# Patient Record
Sex: Female | Born: 1950 | Race: White | Hispanic: No | Marital: Married | Smoking: Former smoker
Health system: Southern US, Community
[De-identification: ages and names within clinical notes are randomized; demographics above are authoritative.]

## PROBLEM LIST (undated history)

## (undated) DIAGNOSIS — Z923 Personal history of irradiation: Secondary | ICD-10-CM

## (undated) DIAGNOSIS — C50919 Malignant neoplasm of unspecified site of unspecified female breast: Secondary | ICD-10-CM

## (undated) DIAGNOSIS — I4891 Unspecified atrial fibrillation: Secondary | ICD-10-CM

## (undated) DIAGNOSIS — I10 Essential (primary) hypertension: Secondary | ICD-10-CM

## (undated) DIAGNOSIS — Z9221 Personal history of antineoplastic chemotherapy: Secondary | ICD-10-CM

## (undated) HISTORY — PX: OOPHORECTOMY: SHX86

## (undated) HISTORY — PX: MASTECTOMY, PARTIAL: SHX709

## (undated) HISTORY — PX: ABDOMINAL HYSTERECTOMY: SHX81

---

## 2004-01-18 DIAGNOSIS — C50919 Malignant neoplasm of unspecified site of unspecified female breast: Secondary | ICD-10-CM

## 2004-01-18 HISTORY — PX: BREAST BIOPSY: SHX20

## 2004-01-18 HISTORY — PX: BREAST LUMPECTOMY: SHX2

## 2004-01-18 HISTORY — DX: Malignant neoplasm of unspecified site of unspecified female breast: C50.919

## 2004-07-28 ENCOUNTER — Ambulatory Visit: Payer: Self-pay

## 2004-08-11 ENCOUNTER — Ambulatory Visit: Payer: Self-pay | Admitting: Surgery

## 2004-08-17 ENCOUNTER — Ambulatory Visit: Payer: Self-pay | Admitting: Surgery

## 2004-09-02 ENCOUNTER — Inpatient Hospital Stay: Payer: Self-pay | Admitting: Surgery

## 2004-09-15 ENCOUNTER — Ambulatory Visit: Payer: Self-pay | Admitting: Oncology

## 2004-09-16 ENCOUNTER — Other Ambulatory Visit: Payer: Self-pay

## 2004-09-17 ENCOUNTER — Ambulatory Visit: Payer: Self-pay | Admitting: Oncology

## 2004-09-29 ENCOUNTER — Ambulatory Visit: Payer: Self-pay | Admitting: Surgery

## 2004-10-17 ENCOUNTER — Ambulatory Visit: Payer: Self-pay | Admitting: Oncology

## 2004-11-17 ENCOUNTER — Ambulatory Visit: Payer: Self-pay | Admitting: Oncology

## 2004-12-17 ENCOUNTER — Ambulatory Visit: Payer: Self-pay | Admitting: Oncology

## 2005-01-17 ENCOUNTER — Ambulatory Visit: Payer: Self-pay | Admitting: Oncology

## 2005-02-17 ENCOUNTER — Ambulatory Visit: Payer: Self-pay | Admitting: Oncology

## 2005-03-17 ENCOUNTER — Ambulatory Visit: Payer: Self-pay | Admitting: Oncology

## 2005-04-17 ENCOUNTER — Ambulatory Visit: Payer: Self-pay | Admitting: Oncology

## 2005-05-17 ENCOUNTER — Ambulatory Visit: Payer: Self-pay | Admitting: Oncology

## 2005-06-17 ENCOUNTER — Ambulatory Visit: Payer: Self-pay | Admitting: Oncology

## 2005-08-03 ENCOUNTER — Ambulatory Visit: Payer: Self-pay | Admitting: Oncology

## 2005-08-17 ENCOUNTER — Ambulatory Visit: Payer: Self-pay | Admitting: Oncology

## 2005-10-03 ENCOUNTER — Ambulatory Visit: Payer: Self-pay | Admitting: Oncology

## 2005-11-01 ENCOUNTER — Ambulatory Visit: Payer: Self-pay | Admitting: Oncology

## 2005-11-14 ENCOUNTER — Ambulatory Visit: Payer: Self-pay | Admitting: Unknown Physician Specialty

## 2005-11-17 ENCOUNTER — Ambulatory Visit: Payer: Self-pay | Admitting: Oncology

## 2006-03-01 ENCOUNTER — Ambulatory Visit: Payer: Self-pay | Admitting: Oncology

## 2006-03-18 ENCOUNTER — Ambulatory Visit: Payer: Self-pay | Admitting: Oncology

## 2006-06-18 ENCOUNTER — Ambulatory Visit: Payer: Self-pay | Admitting: Oncology

## 2006-06-19 ENCOUNTER — Ambulatory Visit: Payer: Self-pay | Admitting: Oncology

## 2006-06-22 ENCOUNTER — Ambulatory Visit: Payer: Self-pay | Admitting: Oncology

## 2006-07-18 ENCOUNTER — Ambulatory Visit: Payer: Self-pay | Admitting: Oncology

## 2006-10-04 ENCOUNTER — Ambulatory Visit: Payer: Self-pay | Admitting: Radiation Oncology

## 2006-10-18 ENCOUNTER — Ambulatory Visit: Payer: Self-pay | Admitting: Radiation Oncology

## 2006-10-20 ENCOUNTER — Ambulatory Visit: Payer: Self-pay | Admitting: Oncology

## 2006-11-18 ENCOUNTER — Ambulatory Visit: Payer: Self-pay | Admitting: Oncology

## 2006-11-18 ENCOUNTER — Ambulatory Visit: Payer: Self-pay | Admitting: Radiation Oncology

## 2006-12-27 ENCOUNTER — Ambulatory Visit: Payer: Self-pay | Admitting: Oncology

## 2007-02-18 ENCOUNTER — Ambulatory Visit: Payer: Self-pay | Admitting: Oncology

## 2007-02-22 ENCOUNTER — Ambulatory Visit: Payer: Self-pay | Admitting: Oncology

## 2007-03-18 ENCOUNTER — Ambulatory Visit: Payer: Self-pay | Admitting: Oncology

## 2007-06-21 ENCOUNTER — Ambulatory Visit: Payer: Self-pay | Admitting: Oncology

## 2007-09-05 ENCOUNTER — Ambulatory Visit: Payer: Self-pay | Admitting: Oncology

## 2007-09-18 ENCOUNTER — Ambulatory Visit: Payer: Self-pay | Admitting: Oncology

## 2007-10-18 ENCOUNTER — Ambulatory Visit: Payer: Self-pay | Admitting: Oncology

## 2007-11-26 ENCOUNTER — Inpatient Hospital Stay: Payer: Self-pay | Admitting: Surgery

## 2008-01-30 ENCOUNTER — Ambulatory Visit: Payer: Self-pay | Admitting: Oncology

## 2008-02-18 ENCOUNTER — Ambulatory Visit: Payer: Self-pay | Admitting: Oncology

## 2008-05-17 ENCOUNTER — Ambulatory Visit: Payer: Self-pay | Admitting: Oncology

## 2008-06-11 ENCOUNTER — Ambulatory Visit: Payer: Self-pay | Admitting: Oncology

## 2008-06-17 ENCOUNTER — Ambulatory Visit: Payer: Self-pay | Admitting: Oncology

## 2008-07-17 ENCOUNTER — Ambulatory Visit: Payer: Self-pay | Admitting: Oncology

## 2008-11-17 ENCOUNTER — Ambulatory Visit: Payer: Self-pay | Admitting: Oncology

## 2008-12-15 ENCOUNTER — Ambulatory Visit: Payer: Self-pay | Admitting: Oncology

## 2008-12-17 ENCOUNTER — Ambulatory Visit: Payer: Self-pay | Admitting: Oncology

## 2009-06-11 ENCOUNTER — Ambulatory Visit: Payer: Self-pay | Admitting: Oncology

## 2009-06-17 ENCOUNTER — Ambulatory Visit: Payer: Self-pay | Admitting: Oncology

## 2009-07-17 ENCOUNTER — Ambulatory Visit: Payer: Self-pay | Admitting: Oncology

## 2009-10-06 ENCOUNTER — Emergency Department: Payer: Self-pay | Admitting: Emergency Medicine

## 2009-12-28 ENCOUNTER — Ambulatory Visit: Payer: Self-pay | Admitting: Oncology

## 2009-12-29 LAB — CANCER ANTIGEN 27.29: CA 27.29: 32.1 U/mL (ref 0.0–38.6)

## 2010-01-17 ENCOUNTER — Ambulatory Visit: Payer: Self-pay | Admitting: Oncology

## 2010-06-29 ENCOUNTER — Ambulatory Visit: Payer: Self-pay | Admitting: Oncology

## 2010-07-05 ENCOUNTER — Ambulatory Visit: Payer: Self-pay | Admitting: Oncology

## 2010-07-18 ENCOUNTER — Ambulatory Visit: Payer: Self-pay | Admitting: Oncology

## 2010-08-24 ENCOUNTER — Ambulatory Visit: Payer: Self-pay | Admitting: Oncology

## 2010-09-18 ENCOUNTER — Ambulatory Visit: Payer: Self-pay | Admitting: Oncology

## 2010-12-28 ENCOUNTER — Ambulatory Visit: Payer: Self-pay | Admitting: Oncology

## 2011-01-18 ENCOUNTER — Ambulatory Visit: Payer: Self-pay | Admitting: Oncology

## 2011-07-06 ENCOUNTER — Ambulatory Visit: Payer: Self-pay | Admitting: Oncology

## 2011-07-07 ENCOUNTER — Ambulatory Visit: Payer: Self-pay | Admitting: Oncology

## 2011-07-07 LAB — CBC CANCER CENTER
Basophil #: 0 x10 3/mm (ref 0.0–0.1)
Basophil %: 0.8 %
Eosinophil #: 0.3 x10 3/mm (ref 0.0–0.7)
HCT: 38.9 % (ref 35.0–47.0)
Lymphocyte #: 1.7 x10 3/mm (ref 1.0–3.6)
MCH: 29.7 pg (ref 26.0–34.0)
MCV: 90 fL (ref 80–100)
Monocyte #: 0.6 x10 3/mm (ref 0.2–0.9)
Monocyte %: 11 %
Neutrophil #: 2.9 x10 3/mm (ref 1.4–6.5)
Platelet: 221 x10 3/mm (ref 150–440)
RBC: 4.35 10*6/uL (ref 3.80–5.20)
WBC: 5.4 x10 3/mm (ref 3.6–11.0)

## 2011-07-07 LAB — COMPREHENSIVE METABOLIC PANEL
Albumin: 3.7 g/dL (ref 3.4–5.0)
Alkaline Phosphatase: 115 U/L (ref 50–136)
BUN: 10 mg/dL (ref 7–18)
Bilirubin,Total: 0.4 mg/dL (ref 0.2–1.0)
Calcium, Total: 9 mg/dL (ref 8.5–10.1)
Chloride: 108 mmol/L — ABNORMAL HIGH (ref 98–107)
EGFR (African American): >60
Potassium: 4 mmol/L (ref 3.5–5.1)
SGOT(AST): 18 U/L (ref 15–37)
SGPT (ALT): 28 U/L
Sodium: 144 mmol/L (ref 136–145)

## 2011-07-08 LAB — CANCER ANTIGEN 27.29: CA 27.29: 24.7 U/mL (ref 0.0–38.6)

## 2011-07-18 ENCOUNTER — Ambulatory Visit: Payer: Self-pay | Admitting: Oncology

## 2012-07-09 ENCOUNTER — Ambulatory Visit: Payer: Self-pay | Admitting: Oncology

## 2012-07-17 ENCOUNTER — Ambulatory Visit: Payer: Self-pay | Admitting: Oncology

## 2012-08-17 ENCOUNTER — Ambulatory Visit: Payer: Self-pay | Admitting: Oncology

## 2013-01-26 ENCOUNTER — Observation Stay: Payer: Self-pay | Admitting: Internal Medicine

## 2013-01-26 LAB — URINALYSIS, COMPLETE
BACTERIA: NONE SEEN
Bilirubin,UR: NEGATIVE
Glucose,UR: NEGATIVE mg/dL (ref 0–75)
Nitrite: NEGATIVE
PROTEIN: NEGATIVE
Ph: 5 (ref 4.5–8.0)
RBC,UR: 10 /HPF (ref 0–5)
SQUAMOUS EPITHELIAL: NONE SEEN
Specific Gravity: 1.029 (ref 1.003–1.030)
WBC UR: 3 /HPF (ref 0–5)

## 2013-01-26 LAB — COMPREHENSIVE METABOLIC PANEL
ALBUMIN: 4.3 g/dL (ref 3.4–5.0)
ALK PHOS: 117 U/L
ALT: 21 U/L (ref 12–78)
ANION GAP: 6 — AB (ref 7–16)
BUN: 20 mg/dL — AB (ref 7–18)
Bilirubin,Total: 0.4 mg/dL (ref 0.2–1.0)
CHLORIDE: 107 mmol/L (ref 98–107)
CREATININE: 0.99 mg/dL (ref 0.60–1.30)
Calcium, Total: 9.1 mg/dL (ref 8.5–10.1)
Co2: 28 mmol/L (ref 21–32)
EGFR (African American): >60
GLUCOSE: 108 mg/dL — AB (ref 65–99)
Osmolality: 284 (ref 275–301)
Potassium: 3.8 mmol/L (ref 3.5–5.1)
SGOT(AST): 10 U/L — ABNORMAL LOW (ref 15–37)
Sodium: 141 mmol/L (ref 136–145)
TOTAL PROTEIN: 7 g/dL (ref 6.4–8.2)

## 2013-01-26 LAB — CBC
HCT: 41.9 % (ref 35.0–47.0)
HGB: 14.1 g/dL (ref 12.0–16.0)
MCH: 29.1 pg (ref 26.0–34.0)
MCHC: 33.6 g/dL (ref 32.0–36.0)
MCV: 87 fL (ref 80–100)
Platelet: 261 10*3/uL (ref 150–440)
RBC: 4.83 10*6/uL (ref 3.80–5.20)
RDW: 13.7 % (ref 11.5–14.5)
WBC: 11.2 10*3/uL — AB (ref 3.6–11.0)

## 2013-01-26 LAB — LIPID PANEL
CHOLESTEROL: 239 mg/dL — AB (ref 0–200)
HDL Cholesterol: 40 mg/dL (ref 40–60)
Ldl Cholesterol, Calc: 160 mg/dL — ABNORMAL HIGH (ref 0–100)
Triglycerides: 197 mg/dL (ref 0–200)
VLDL Cholesterol, Calc: 39 mg/dL (ref 5–40)

## 2013-01-26 LAB — PROTIME-INR
INR: 1
Prothrombin Time: 12.9 secs (ref 11.5–14.7)

## 2013-01-26 LAB — CK-MB
CK-MB: 1.2 ng/mL (ref 0.5–3.6)
CK-MB: 1.5 ng/mL (ref 0.5–3.6)

## 2013-01-26 LAB — TROPONIN I
Troponin-I: <0.02 ng/mL
Troponin-I: <0.02 ng/mL

## 2013-01-27 LAB — CBC WITH DIFFERENTIAL/PLATELET
Basophil #: 0.1 10*3/uL (ref 0.0–0.1)
Basophil %: 0.6 %
EOS ABS: 0.1 10*3/uL (ref 0.0–0.7)
EOS PCT: 1.5 %
HCT: 38.7 % (ref 35.0–47.0)
HGB: 13.5 g/dL (ref 12.0–16.0)
LYMPHS ABS: 2.3 10*3/uL (ref 1.0–3.6)
Lymphocyte %: 26.3 %
MCH: 30.2 pg (ref 26.0–34.0)
MCHC: 34.9 g/dL (ref 32.0–36.0)
MCV: 87 fL (ref 80–100)
Monocyte #: 0.8 x10 3/mm (ref 0.2–0.9)
Monocyte %: 8.9 %
NEUTROS ABS: 5.5 10*3/uL (ref 1.4–6.5)
NEUTROS PCT: 62.7 %
Platelet: 236 10*3/uL (ref 150–440)
RBC: 4.47 10*6/uL (ref 3.80–5.20)
RDW: 13.8 % (ref 11.5–14.5)
WBC: 8.8 10*3/uL (ref 3.6–11.0)

## 2013-01-27 LAB — BASIC METABOLIC PANEL
ANION GAP: 7 (ref 7–16)
BUN: 14 mg/dL (ref 7–18)
CALCIUM: 8.6 mg/dL (ref 8.5–10.1)
CHLORIDE: 108 mmol/L — AB (ref 98–107)
Co2: 24 mmol/L (ref 21–32)
Creatinine: 0.71 mg/dL (ref 0.60–1.30)
Glucose: 97 mg/dL (ref 65–99)
Osmolality: 278 (ref 275–301)
Potassium: 3.3 mmol/L — ABNORMAL LOW (ref 3.5–5.1)
SODIUM: 139 mmol/L (ref 136–145)

## 2013-01-27 LAB — TROPONIN I: Troponin-I: <0.02 ng/mL

## 2013-01-27 LAB — CK-MB: CK-MB: 0.9 ng/mL (ref 0.5–3.6)

## 2013-03-28 DIAGNOSIS — E7800 Pure hypercholesterolemia, unspecified: Secondary | ICD-10-CM | POA: Insufficient documentation

## 2013-03-28 DIAGNOSIS — G5600 Carpal tunnel syndrome, unspecified upper limb: Secondary | ICD-10-CM | POA: Insufficient documentation

## 2013-03-28 DIAGNOSIS — R51 Headache: Secondary | ICD-10-CM

## 2013-03-28 DIAGNOSIS — R519 Headache, unspecified: Secondary | ICD-10-CM | POA: Insufficient documentation

## 2013-05-22 DIAGNOSIS — R4182 Altered mental status, unspecified: Secondary | ICD-10-CM | POA: Insufficient documentation

## 2013-05-22 DIAGNOSIS — G454 Transient global amnesia: Secondary | ICD-10-CM | POA: Insufficient documentation

## 2013-07-10 ENCOUNTER — Ambulatory Visit: Payer: Self-pay | Admitting: Oncology

## 2013-08-21 ENCOUNTER — Ambulatory Visit: Payer: Self-pay | Admitting: Oncology

## 2013-09-17 ENCOUNTER — Ambulatory Visit: Payer: Self-pay | Admitting: Oncology

## 2014-05-02 ENCOUNTER — Other Ambulatory Visit: Payer: Self-pay | Admitting: Oncology

## 2014-05-02 DIAGNOSIS — Z853 Personal history of malignant neoplasm of breast: Secondary | ICD-10-CM

## 2014-05-10 NOTE — Discharge Summary (Signed)
PATIENT NAME:  Natalie Monroe, Natalie Monroe MR#:  [id number] DATE OF BIRTH:  1950/03/23  DATE OF ADMISSION:  01/26/2013 DATE OF DISCHARGE:  01/27/2013  ADMISSION DIAGNOSIS: Confusion, rule out cerebrovascular accident.   DISCHARGE DIAGNOSIS:  1.  Transient global amnesia.  2.  Migraine headaches.   CONSULTATIONS: None.   IMAGING: The patient underwent a MRI of the brain which was negative for acute ischemia.   Carotid ultrasound showed no hemodynamically significant stenosis.   A 2-D echocardiogram showed an EF of 65% to 70% with borderline left ventricular hypertrophy, impaired relaxation of LV diastolic dysfunction.   LABORATORY DATA: White blood cells 8.8, hemoglobin 13.5, hematocrit 39, and platelets 236. Sodium 139, potassium 3.3, chloride 108, bicarb 24, BUN 14, creatinine 0.71, and glucose is 97. Troponins were negative.  HOSPITAL COURSE: A very pleasant 64 year old female who presented with confusion. For further details, please refer to the H and P.  1.  Transient global amnesia. The patient underwent CVA work-up including a 2-D echocardiogram, MRI, and carotid ultrasound all of which were essentially negative. I suspect her symptoms actually were from transient global amnesia probably precipitated by her history of migraines as well as stress, possibly also chemical induced as she was surrounded by toxic fumes while cleaning her bathroom. Her memory has now resolved. I did give her some information regarding transient global amnesia and she follows up with Dr. Manuella Ghazi, [facility] neurology, which I recommended she continue to follow up with regarding her migraines and this new diagnosis.  2.  Migraines. The patient for now we will continue her outpatient medications.   DISCHARGE MEDICATIONS: 1.  Zoloft 50 mg daily. 2.  Propranolol 80 mg daily.  3.  Amitriptyline 50 mg as needed for migraines.  4.  Triamcinolone to effected area daily.  DISCHARGE DIET: Regular diet.   DISCHARGE  ACTIVITY: As tolerated.   DISCHARGE FOLLOWUP: She will follow up with Dr. Lovie Macadamia in 1 week and [facility] neurology in 2 to 4 weeks.   The patient is medically stable for discharge.  TIME SPENT: Approximately 35 minutes.   ___________________________ Donell Beers. Benjie Karvonen, MD spm:sb D: 01/27/2013 15:15:22 ET T: 01/28/2013 09:03:10 ET JOB#: [id number]  cc: Keneisha Heckart P. Benjie Karvonen, MD, <Dictator> Youlanda Roys. Lovie Macadamia, MD Hemang K. Manuella Ghazi, MD Donell Beers Martinez Boxx MD ELECTRONICALLY SIGNED 01/28/2013 14:21

## 2014-05-10 NOTE — H&P (Signed)
Monroe NAME:  Natalie Monroe, Natalie Monroe MR#:  [id number] DATE OF BIRTH:  30-Dec-1950  DATE OF ADMISSION:  01/26/2013  ADMITTING PHYSICIAN: Gladstone Lighter, MD  PRIMARY CARE PHYSICIAN: Youlanda Roys. Lovie Macadamia, MD  CHIEF COMPLAINT: Confusion and memory loss.   HISTORY OF PRESENT ILLNESS: Natalie Monroe is a 64 year old, very pleasant Caucasian female with past medical history significant for hypertension, migraine headaches, history of breast cancer, status post partial mastectomy on Natalie right side with chemoradiation, currently in remission, who presents to [facility] hospital secondary to acute onset of memory loss and confusion that started this afternoon. Natalie Monroe is alert and oriented at this time. Husband is at bedside, but Natalie Monroe does not remember anything that happened this afternoon. According to husband, she woke up fine. She ate her breakfast. They were thinking about cleaning Natalie house to put it on sale. He left Natalie Monroe while she was ready to clean her bathroom with Clorox and Lysol. He came back home after 25 minutes, and she has been walking confused in Natalie room and says something is wrong. Now Natalie Monroe does not remember any of these events. She keeps saying that she smells Clorox now. She does not remember what she had for breakfast. She still remembers her physician's name, her medications, her husband's name, but does not have that memory of what happened today. She does have a strong history of strokes in Natalie family. Does not complain of any other loss of motor strength or sensation or visual changes or dysphagia.   PAST MEDICAL HISTORY: 1.  Migraine headaches.  2.  History of breast cancer, currently in remission. 3.  Hypertension.   PAST SURGICAL HISTORY: 1.  Hysterectomy for fibroids.  2.  Right breast partial mastectomy. 3.  Right breast lumpectomy.   ALLERGIES TO MEDICATIONS: No known drug allergies.   CURRENT HOME MEDICATIONS:  1.  Sertraline 50 mg p.o. daily.  2.   Amitriptyline 50 mg p.r.n. for headaches.  3.  Propranolol 80 mg p.o. daily.  4.  Triamcinolone topical solution as needed for rash.   SOCIAL HISTORY: Lives at home with her husband. Used to smoke in Natalie past but quit smoking more than 20 years ago now. Occasional wine drinking. No other recreational drugs. Currently works in a clerical position.   FAMILY HISTORY: Significant for heart disease and stroke in Natalie family.   REVIEW OF SYSTEMS:    CONSTITUTIONAL: No fever, fatigue or weakness.  EYES: No blurred vision, double vision, pain, inflammation or glaucoma.  EARS, NOSE, THROAT: No tinnitus, ear pain, hearing loss, epistaxis or discharge.  RESPIRATORY: No cough, wheeze, hemoptysis or COPD.  CARDIOVASCULAR: No chest pain, orthopnea, edema, arrhythmia, palpitations or syncope.  GASTROINTESTINAL: No nausea, vomiting, abdominal pain, hematemesis or melena.  GENITOURINARY: No dysuria, hematochezia, renal calculus, frequency or incontinence.  ENDOCRINE: No polyuria, nocturia, thyroid problems, heat or cold intolerance.  HEMATOLOGIC: No anemia, easy bruising or bleeding.  SKIN: No acne, rash or lesions.  MUSCULOSKELETAL: No neck, back, shoulder pain, arthritis or gout.  NEUROLOGIC: No numbness, weakness, CVA, TIA or seizures.  PSYCHOLOGICAL: No anxiety, insomnia, depression.   PHYSICAL EXAMINATION: VITAL SIGNS: Temperature of 98 degrees Fahrenheit, pulse 91, respirations 20, blood pressure 175/78, pulse oximetry 98% on room air.  GENERAL: Well-built, well-nourished female lying in bed, not in any acute distress.  HEENT: Normocephalic, atraumatic. Pupils equal, round, reacting to light. Anicteric sclerae. Extraocular movements intact. Oropharynx clear without erythema, mass or exudates.  NECK: Supple. No thyromegaly, JVD or carotid  bruits. Normal range of motion without any pain.  LUNGS: Clear to auscultation bilaterally. No wheeze or crackles. No use of accessory muscles for  breathing. CARDIOVASCULAR: S1, S2, regular rate and rhythm. No murmurs, rubs or gallops.  ABDOMEN: Soft, nontender, nondistended. No hepatosplenomegaly. Normal bowel sounds.  EXTREMITIES: No pedal edema, no clubbing or cyanosis, 2+ dorsalis pedis pulses bilaterally.  SKIN: No acne, rash or lesions.  LYMPHATICS: No cervical lymphadenopathy.  NEUROLOGIC: Cranial nerves II through XII remain intact. Motor strength 5/5 all 4 extremities. Sensation is intact. No abnormal cerebellar function tests.  PSYCHOLOGIC: Natalie Monroe is awake, alert, oriented x 3 at this time.   LABORATORY DATA: WBC 11.3, hemoglobin 14.1, hematocrit 41. 0.9, platelet count 61.   Sodium 141, potassium 3.8, chloride 107, bicarbonate 28, BUN 20, creatinine 0.99, glucose 108, and calcium of 9.1.   ALT 21, AST 10, alkaline phosphatase 117, total bilirubin 0.4, and albumin of 4.3. INR is 1.0. Troponin less than 0.02. Urinalysis negative for any infection.   CT of Natalie head without any contrast showing no acute intracranial abnormality. Chest x-ray showing no active cardiopulmonary disease.   Venous gas showing pH of 7.35, pCO2 of 48.   Co-oximetry showing carboxyhemoglobin is low at 1.4 and methemoglobin is also low at 0.3.   EKG showing normal sinus rhythm, T wave inversions noted in V4 and V5, which are seen on a previous EKG, and heart rate of 87.   ASSESSMENT AND PLAN: This is a 64 year old female with history of hypertension and breast cancer in remission, presents with acute memory loss and confusion.  1.  Acute onset of memory loss and confusion, started this afternoon and could not remember any events from this afternoon. Natalie Monroe is now more alert and oriented. Could be transient ischemic attack versus a chemical reaction, as she has been exposed to Clorox and Lysol fumes in a closed room. No respiratory issues at this time. Will admit her under observation to telemetry. Check MRI of Natalie brain, carotid Dopplers, and  echocardiogram. Also, she reports of dysosmia. Started her on aspirin and check lipid profile.  2.  Hypertension: Continue propranolol. 3.  Breast cancer: Seems to be in remission. She had Natalie cancer in 2006. She had partial mastectomy on Natalie right side, chemo and radiation, and finished her hormonal treatment.  4.  Deep vein thrombosis prophylaxis with Lovenox.  CODE STATUS: Full code.   TIME SPENT ON ADMISSION: 50 minutes.   ____________________________ Gladstone Lighter, MD rk:jcm D: 01/26/2013 19:16:28 ET T: 01/26/2013 20:07:03 ET JOB#: [id number]  cc: Gladstone Lighter, MD, <Dictator> Youlanda Roys. Lovie Macadamia, MD Gladstone Lighter MD ELECTRONICALLY SIGNED 01/29/2013 14:20

## 2014-07-10 ENCOUNTER — Other Ambulatory Visit: Payer: Self-pay | Admitting: Urology

## 2014-07-10 DIAGNOSIS — R319 Hematuria, unspecified: Secondary | ICD-10-CM

## 2014-07-14 ENCOUNTER — Ambulatory Visit
Admission: RE | Admit: 2014-07-14 | Discharge: 2014-07-14 | Disposition: A | Discharge status: Home / Self Care | Payer: BLUE CROSS/BLUE SHIELD | Source: Ambulatory Visit | Attending: Oncology | Admitting: Oncology

## 2014-07-14 DIAGNOSIS — Z1231 Encounter for screening mammogram for malignant neoplasm of breast: Secondary | ICD-10-CM | POA: Diagnosis not present

## 2014-07-14 DIAGNOSIS — Z853 Personal history of malignant neoplasm of breast: Secondary | ICD-10-CM | POA: Insufficient documentation

## 2014-07-14 HISTORY — DX: Malignant neoplasm of unspecified site of unspecified female breast: C50.919

## 2014-07-15 ENCOUNTER — Ambulatory Visit
Admission: RE | Admit: 2014-07-15 | Discharge: 2014-07-15 | Disposition: A | Discharge status: Home / Self Care | Payer: BLUE CROSS/BLUE SHIELD | Source: Ambulatory Visit | Attending: Urology | Admitting: Urology

## 2014-07-15 DIAGNOSIS — R310 Gross hematuria: Secondary | ICD-10-CM | POA: Diagnosis present

## 2014-07-15 DIAGNOSIS — R319 Hematuria, unspecified: Secondary | ICD-10-CM

## 2014-07-15 DIAGNOSIS — N202 Calculus of kidney with calculus of ureter: Secondary | ICD-10-CM | POA: Insufficient documentation

## 2014-07-15 HISTORY — DX: Essential (primary) hypertension: I10

## 2014-07-15 MED ORDER — IOHEXOL 300 MG/ML  SOLN
125.0000 mL | Freq: Once | INTRAMUSCULAR | Status: AC | PRN
  Administered 2014-07-15: 115 mL via INTRAVENOUS

## 2014-08-06 DIAGNOSIS — C50919 Malignant neoplasm of unspecified site of unspecified female breast: Secondary | ICD-10-CM | POA: Insufficient documentation

## 2014-08-20 ENCOUNTER — Other Ambulatory Visit: Payer: Self-pay | Admitting: Oncology

## 2014-08-21 LAB — CBC WITH DIFFERENTIAL/PLATELET
BASOS: 1 %
Basophils Absolute: 0 10*3/uL (ref 0.0–0.2)
EOS (ABSOLUTE): 0.3 10*3/uL (ref 0.0–0.4)
Eos: 4 %
Hematocrit: 40.9 % (ref 34.0–46.6)
Hemoglobin: 14 g/dL (ref 11.1–15.9)
IMMATURE GRANULOCYTES: 0 %
Immature Grans (Abs): 0 10*3/uL (ref 0.0–0.1)
LYMPHS: 31 %
Lymphocytes Absolute: 2.3 10*3/uL (ref 0.7–3.1)
MCH: 29.7 pg (ref 26.6–33.0)
MCHC: 34.2 g/dL (ref 31.5–35.7)
MCV: 87 fL (ref 79–97)
MONOS ABS: 0.6 10*3/uL (ref 0.1–0.9)
Monocytes: 8 %
Neutrophils Absolute: 4.1 10*3/uL (ref 1.4–7.0)
Neutrophils: 56 %
PLATELETS: 268 10*3/uL (ref 150–379)
RBC: 4.72 x10E6/uL (ref 3.77–5.28)
RDW: 13.6 % (ref 12.3–15.4)
WBC: 7.4 10*3/uL (ref 3.4–10.8)

## 2014-08-21 LAB — COMPREHENSIVE METABOLIC PANEL
A/G RATIO: 2.6 — AB (ref 1.1–2.5)
ALT: 12 IU/L (ref 0–32)
AST: 11 IU/L (ref 0–40)
Albumin: 4.5 g/dL (ref 3.6–4.8)
Alkaline Phosphatase: 94 IU/L (ref 39–117)
BILIRUBIN TOTAL: 0.2 mg/dL (ref 0.0–1.2)
BUN/Creatinine Ratio: 24 (ref 11–26)
BUN: 17 mg/dL (ref 8–27)
CALCIUM: 9.3 mg/dL (ref 8.7–10.3)
CO2: 17 mmol/L — ABNORMAL LOW (ref 18–29)
CREATININE: 0.71 mg/dL (ref 0.57–1.00)
Chloride: 105 mmol/L (ref 97–108)
GFR, EST AFRICAN AMERICAN: 106 mL/min/{1.73_m2} (ref >59)
GFR, EST NON AFRICAN AMERICAN: 92 mL/min/{1.73_m2} (ref >59)
Globulin, Total: 1.7 g/dL (ref 1.5–4.5)
Glucose: 99 mg/dL (ref 65–99)
POTASSIUM: 4.5 mmol/L (ref 3.5–5.2)
Sodium: 145 mmol/L — ABNORMAL HIGH (ref 134–144)
Total Protein: 6.2 g/dL (ref 6.0–8.5)

## 2014-08-21 LAB — CA 27.29 (SERIAL MONITOR): CA 27.29: 24.1 U/mL (ref 0.0–38.6)

## 2014-08-27 ENCOUNTER — Encounter: Payer: Self-pay | Admitting: Oncology

## 2014-08-27 ENCOUNTER — Inpatient Hospital Stay: Payer: BLUE CROSS/BLUE SHIELD | Attending: Oncology | Admitting: Oncology

## 2014-08-27 VITALS — BP 133/78 | HR 74 | Temp 97.1°F | Wt 143.5 lb

## 2014-08-27 DIAGNOSIS — Z7982 Long term (current) use of aspirin: Secondary | ICD-10-CM

## 2014-08-27 DIAGNOSIS — Z79899 Other long term (current) drug therapy: Secondary | ICD-10-CM | POA: Diagnosis not present

## 2014-08-27 DIAGNOSIS — I10 Essential (primary) hypertension: Secondary | ICD-10-CM | POA: Insufficient documentation

## 2014-08-27 DIAGNOSIS — Z853 Personal history of malignant neoplasm of breast: Secondary | ICD-10-CM

## 2014-08-27 DIAGNOSIS — Z87891 Personal history of nicotine dependence: Secondary | ICD-10-CM | POA: Diagnosis not present

## 2014-08-27 DIAGNOSIS — C50911 Malignant neoplasm of unspecified site of right female breast: Secondary | ICD-10-CM

## 2014-08-27 DIAGNOSIS — Z9221 Personal history of antineoplastic chemotherapy: Secondary | ICD-10-CM | POA: Insufficient documentation

## 2014-08-27 NOTE — Progress Notes (Signed)
Patient does not have living will.  Materials given.  Former smoker.  Patient currently having problems with renal calculi.  Feels like she has a bladder infection,  Dr. Eliberto Ivory following patient.

## 2014-08-30 ENCOUNTER — Encounter: Payer: Self-pay | Admitting: Oncology

## 2014-08-30 NOTE — Progress Notes (Signed)
[facility]set @ [facility] Telephone:(336) [phone]  Fax:(336) [place]: [birth year]  MR#: [id number]  URK#:[id number]  Patient Care Team: Juluis Pitch, MD as PCP - General (Family Medicine)  CHIEF COMPLAINT:  Chief Complaint  Patient presents with  . Follow-up   Chief Complaint/Problem List:  1.  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 3.2 cm in size. ER/PR positive. 1/13 lymph nodes are positive. Status post lumpectomy and axillary node dissection. T2N1M0 tumor. Diagnosis in August 2006. Stage II-A. HER-2/neu negative. ER/PR positive. 2.  Finished chemotherapy in December of 2006 with Taxotere, Cytoxan, Adriamycin 6 cycles. 3.  Finished radiation therapy to the breast in March of 2007. 4.  Started on Arimidex in December of 2006.   Switch to tamoxifen because of side effect to joint pains.  Patient has stopped taking tamoxifen in December of 2011    INTERVAL HISTORY:  64 year old lady came today for yearly checkup regarding carcinoma of breast.  Patient is off all antibiotic hormonal therapy.  Since last evaluation patient did not have any significant problem.  No chills.  No fever.  No nausea.  No vomiting.  No bony pains.  Getting regular mammograms done REVIEW OF SYSTEMS:   GENERAL:  Feels good.  Active.  No fevers, sweats or weight loss. PERFORMANCE STATUS (ECOG0 HEENT:  No visual changes, runny nose, sore throat, mouth sores or tenderness. Lungs: No shortness of breath or cough.  No hemoptysis. Cardiac:  No chest pain, palpitations, orthopnea, or PND. GI:  No nausea, vomiting, diarrhea, constipation, melena or hematochezia. GU:  No urgency, frequency, dysuria, or hematuria. Musculoskeletal:  No back pain.  No joint pain.  No muscle tenderness. Extremities:  No pain or swelling. Skin:  No rashes or skin changes. Neuro:  No headache, numbness or weakness, balance or coordination issues. Endocrine:  No diabetes, thyroid issues, hot  flashes or night sweats. Psych:  No mood changes, depression or anxiety. Pain:  No focal pain. Review of systems:  All other systems reviewed and found to be negative. As per HPI. Otherwise, a complete review of systems is negatve.  PAST MEDICAL HISTORY: Past Medical History  Diagnosis Date  . Breast cancer 2006    right breast, chemo and radiation  . Hypertension     PAST SURGICAL HISTORY: Past Surgical History  Procedure Laterality Date  . Breast biopsy Bilateral 2006    left negative right positive  . Breast lumpectomy Right 2006    positive    FAMILY HISTORY Family History  Problem Relation Age of Onset  . Breast cancer Maternal Aunt 70   Outpatient Medications (11) Hospital Medications (0) Clinic-Administered Medications (0)    Aspirin-Acetaminophen-Caffeine (EXCEDRIN PO)   Biotin 1000 MCG tablet   Cyanocobalamin (RA VITAMIN B-12 TR) 1000 MCG TBCR   metoprolol succinate (TOPROL-XL) 100 MG 24 hr tablet   NAPROXEN PO   NUCYNTA 50 MG TABS tablet   ondansetron (ZOFRAN-ODT) 8 MG disintegrating tablet   sertraline (ZOLOFT) 50 MG tablet   SUMAtriptan (IMITREX) 100 MG tablet   triamcinolone cream (KENALOG) 0.1 %   valACYclovir (VALTREX) 1000 MG tablet     ADVANCED DIRECTIVES:  Patient does not have any living will or healthcare power of attorney.  Information was given .  Available resources had been discussed.  We will follow-up on subsequent appointments regarding this issue HEALTH MAINTENANCE: Social History  Substance Use Topics  . Smoking status: Former Research scientist (life sciences)  . Smokeless tobacco: None  . Alcohol  Use: None      No Known Allergies    OBJECTIVE:  Filed Vitals:   08/27/14 1020  BP: 133/78  Pulse: 74  Temp: 97.1 F (36.2 C)     There is no height on file to calculate BMI.    ECOG FS:0 - Asymptomatic  PHYSICAL EXAM: GENERAL:  Well developed, well nourished, sitting comfortably in the exam room in no acute distress. MENTAL STATUS:  Alert and  oriented to person, place and time.  ENT:  Oropharynx clear without lesion.  Tongue normal. Mucous membranes moist.  RESPIRATORY:  Clear to auscultation without rales, wheezes or rhonchi. CARDIOVASCULAR:  Regular rate and rhythm without murmur, rub or gallop. BREAST:  Right breast without masses, skin changes or nipple discharge.  Left breast without masses, skin changes or nipple discharge. ABDOMEN:  Soft, non-tender, with active bowel sounds, and no hepatosplenomegaly.  No masses. BACK:  No CVA tenderness.  No tenderness on percussion of the back or rib cage. SKIN:  No rashes, ulcers or lesions. EXTREMITIES: No edema, no skin discoloration or tenderness.  No palpable cords. LYMPH NODES: No palpable cervical, supraclavicular, axillary or inguinal adenopathy  NEUROLOGICAL: Unremarkable. PSYCH:  Appropriate.   LAB RESULTS:  CBC Latest Ref Rng 08/20/2014 01/27/2013  WBC 3.4 - 10.8 x10E3/uL 7.4 8.8  Hemoglobin 12.0-16.0 g/dL - 13.5  Hematocrit 34.0 - 46.6 % 40.9 38.7  Platelets 150-440 x10 3/mm 3 - 236    No visits with results within 5 Day(s) from this visit. Latest known visit with results is:  Orders Only on 08/20/2014  Component Date Value Ref Range Status  . WBC 08/20/2014 7.4  3.4 - 10.8 x10E3/uL Final  . RBC 08/20/2014 4.72  3.77 - 5.28 x10E6/uL Final  . Hemoglobin 08/20/2014 14.0  11.1 - 15.9 g/dL Final  . Hematocrit 08/20/2014 40.9  34.0 - 46.6 % Final  . MCV 08/20/2014 87  79 - 97 fL Final  . MCH 08/20/2014 29.7  26.6 - 33.0 pg Final  . MCHC 08/20/2014 34.2  31.5 - 35.7 g/dL Final  . RDW 08/20/2014 13.6  12.3 - 15.4 % Final  . Platelets 08/20/2014 268  150 - 379 x10E3/uL Final  . Neutrophils 08/20/2014 56   Final  . Lymphs 08/20/2014 31   Final  . Monocytes 08/20/2014 8   Final  . Eos 08/20/2014 4   Final  . Basos 08/20/2014 1   Final  . Neutrophils Absolute 08/20/2014 4.1  1.4 - 7.0 x10E3/uL Final  . Lymphocytes Absolute 08/20/2014 2.3  0.7 - 3.1 x10E3/uL Final  .  Monocytes Absolute 08/20/2014 0.6  0.1 - 0.9 x10E3/uL Final  . EOS (ABSOLUTE) 08/20/2014 0.3  0.0 - 0.4 x10E3/uL Final  . Basophils Absolute 08/20/2014 0.0  0.0 - 0.2 x10E3/uL Final  . Immature Granulocytes 08/20/2014 0   Final  . Immature Grans (Abs) 08/20/2014 0.0  0.0 - 0.1 x10E3/uL Final  . Glucose 08/20/2014 99  65 - 99 mg/dL Final  . BUN 08/20/2014 17  8 - 27 mg/dL Final  . Creatinine, Ser 08/20/2014 0.71  0.57 - 1.00 mg/dL Final  . GFR calc non Af Amer 08/20/2014 92  >59 mL/min/1.73 Final  . GFR calc Af Amer 08/20/2014 106  >59 mL/min/1.73 Final  . BUN/Creatinine Ratio 08/20/2014 24  11 - 26 Final  . Sodium 08/20/2014 145* 134 - 144 mmol/L Final  . Potassium 08/20/2014 4.5  3.5 - 5.2 mmol/L Final  . Chloride 08/20/2014 105  97 - 108 mmol/L  Final  . CO2 08/20/2014 17* 18 - 29 mmol/L Final  . Calcium 08/20/2014 9.3  8.7 - 10.3 mg/dL Final  . Total Protein 08/20/2014 6.2  6.0 - 8.5 g/dL Final  . Albumin 08/20/2014 4.5  3.6 - 4.8 g/dL Final  . Globulin, Total 08/20/2014 1.7  1.5 - 4.5 g/dL Final  . Albumin/Globulin Ratio 08/20/2014 2.6* 1.1 - 2.5 Final  . Bilirubin Total 08/20/2014 0.2  0.0 - 1.2 mg/dL Final  . Alkaline Phosphatase 08/20/2014 94  39 - 117 IU/L Final  . AST 08/20/2014 11  0 - 40 IU/L Final  . ALT 08/20/2014 12  0 - 32 IU/L Final  . CA 27.29 08/20/2014 24.1  0.0 - 38.6 U/mL Final   [facility]/ACS methodology       ASSESSMENT: Carcinoma  of right breast T2 N1 M0 tumor status post lumpectomy radiation therapy and chemotherapy On clinical examination there is no evidence of recurrent or progressive disease  MEDICAL DECISION MAKING:  All lab data has been reviewed they are within normal limits tumor markers are within normal limit  Patient expressed understanding and was in agreement with this plan. She also understands that She can call clinic at any time with any questions, concerns, or complaints.    No matching staging information was found for the  patient.  Forest Gleason, MD   08/30/2014 9:19 AM

## 2015-04-13 DIAGNOSIS — I10 Essential (primary) hypertension: Secondary | ICD-10-CM | POA: Insufficient documentation

## 2015-07-16 ENCOUNTER — Ambulatory Visit
Admission: RE | Admit: 2015-07-16 | Discharge: 2015-07-16 | Disposition: A | Discharge status: Home / Self Care | Payer: BLUE CROSS/BLUE SHIELD | Source: Ambulatory Visit | Attending: Oncology | Admitting: Oncology

## 2015-07-16 ENCOUNTER — Other Ambulatory Visit: Payer: Self-pay | Admitting: Oncology

## 2015-07-16 DIAGNOSIS — C50911 Malignant neoplasm of unspecified site of right female breast: Secondary | ICD-10-CM

## 2015-07-16 DIAGNOSIS — Z1231 Encounter for screening mammogram for malignant neoplasm of breast: Secondary | ICD-10-CM | POA: Insufficient documentation

## 2015-08-17 ENCOUNTER — Encounter: Payer: Self-pay | Admitting: Oncology

## 2015-08-26 NOTE — Progress Notes (Signed)
[facility] day:  08/27/15  Chief Complaint: Natalie Monroe is a 65 y.o. female with stage IIA right breast cancer who is seen for reassessment.  HPI:   The patient was diagnosed with right breast cancer in 2006.  Breast mass was discovered by a nurse midwife.  She underwent lumpectomy and axillary node dissection in 08/2004 by Dr. Rochel Brome.  She states that she then underwent partial mastectomy secondary to positive margins.  Pathology revealed a 3.2 cm invasive lobular carcinoma.  One of 13 lymph nodes were positive.  Tumor was ER positive and PR positive.  Pathologic stage was T2N1M0.  She received 6 cycles of Taxotere, Adriamycin, and Cytoxan (TAC).  Chemotherapy completed in 12/2004,  She completed radiation in 03/2005.  She began Arimidex in 12/2004.  She switched to tamoxifen secondary to side effects (joint pain).  She stopped tamoxifen in 12/2009.  She was last seen in the medical oncology clinic on 08/27/2014.  At that time, she was doing well.  CA27.29 was 24.1.  Mammogram on 07/16/2015 revealed no evidence of malignancy.  Bone density study on 06/15/2005 revealed osteopenia with a T score of -1.1 in the right hip.  Symptomatically, she denies any complaint.   Past Medical History:  Diagnosis Date  . Breast cancer [facility]) 2006   right breast, chemo and radiation  . Hypertension     Past Surgical History:  Procedure Laterality Date  . BREAST BIOPSY Bilateral 2006   left negative right positive  . BREAST LUMPECTOMY Right 2006   positive  . MASTECTOMY, PARTIAL Right     Family History  Problem Relation Age of Onset  . Breast cancer Maternal Aunt 70    Social History:  reports that she has quit smoking. She does not have any smokeless tobacco history on file. Her alcohol and drug histories are not on file.  She works in a [employer].  She lives in [place].  The patient is alone today.  Allergies: No Known  Allergies  Current Medications: Current Outpatient Prescriptions  Medication Sig Dispense Refill  . Aspirin-Acetaminophen-Caffeine (EXCEDRIN PO) Take by mouth.    . metoprolol succinate (TOPROL-XL) 100 MG 24 hr tablet Take 100 mg by mouth daily.     Marland Kitchen NAPROXEN PO Take by mouth.    . SUMAtriptan (IMITREX) 100 MG tablet TAKE 1 TABLET BY MOUTH AS DIRECTED AS NEEDED MAY REPEAT IN 2 HOURS IFNEEDED    . triamcinolone cream (KENALOG) 0.1 % APPLY SPARINGLY AND RUB IN WELL TO AFFECTED AREAS DAILY OR USE AS DIRECTED    . valACYclovir (VALTREX) 1000 MG tablet Take 2,000 mg by mouth.     No current facility-administered medications for this visit.     Review of Systems:  GENERAL:  Feels good. No fevers, sweats or weight loss. PERFORMANCE STATUS (ECOG): 0 HEENT:  No visual changes, runny nose, sore throat, mouth sores or tenderness. Lungs: No shortness of breath or cough.  No hemoptysis. Cardiac:  No chest pain, palpitations, orthopnea, or PND. GI:  No nausea, vomiting, diarrhea, constipation, melena or hematochezia. GU:  No urgency, frequency, dysuria, or hematuria. Musculoskeletal:  No back pain.  No joint pain.  No muscle tenderness. Extremities:  No pain or swelling. Skin:  No rashes or skin changes. Neuro:  No headache, numbness or weakness, balance or coordination issues. Endocrine:  No diabetes, thyroid issues, hot flashes or night sweats. Psych:  No mood changes, depression or anxiety. Pain:  No focal pain. Review of systems:  All other systems reviewed and found to be negative.  Physical Exam: Blood pressure 138/89, pulse 69, temperature 97.8 F (36.6 C), temperature source Tympanic, resp. rate 18, weight 138 lb 9 oz (62.9 kg). GENERAL:  Well developed, well nourished, woman sitting comfortably in the exam room in no acute distress. MENTAL STATUS:  Alert and oriented to person, place and time. HEAD:  Short blonde hair.  Normocephalic, atraumatic, face symmetric, no Cushingoid  features. EYES:  Glasses.  Blue eyes.  Pupils equal round and reactive to light and accomodation.  No conjunctivitis or scleral icterus. ENT:  Oropharynx clear without lesion.  Tongue normal. Mucous membranes moist.  RESPIRATORY:  Clear to auscultation without rales, wheezes or rhonchi. CARDIOVASCULAR:  Regular rate and rhythm without murmur, rub or gallop. BREAST:  Right breast s/p lumpectomy with well healed lateral semicircular incision.  No masses, skin changes or nipple discharge.  Left breast with fibrocystic changes.  No masses, skin changes or nipple discharge.  ABDOMEN:  Soft, non-tender, with active bowel sounds, and no hepatosplenomegaly.  No masses. SKIN:  No rashes, ulcers or lesions. EXTREMITIES: No edema, no skin discoloration or tenderness.  No palpable cords. LYMPH NODES: No palpable cervical, supraclavicular, axillary or inguinal adenopathy  NEUROLOGICAL: Unremarkable. PSYCH:  Appropriate.  No visits with results within 3 Day(s) from this visit.  Latest known visit with results is:  Orders Only on 08/20/2014  Component Date Value Ref Range Status  . WBC 08/21/2014 7.4  3.4 - 10.8 x10E3/uL Final  . RBC 08/21/2014 4.72  3.77 - 5.28 x10E6/uL Final  . Hemoglobin 08/21/2014 14.0  11.1 - 15.9 g/dL Final  . Hematocrit 08/21/2014 40.9  34.0 - 46.6 % Final  . MCV 08/21/2014 87  79 - 97 fL Final  . MCH 08/21/2014 29.7  26.6 - 33.0 pg Final  . MCHC 08/21/2014 34.2  31.5 - 35.7 g/dL Final  . RDW 08/21/2014 13.6  12.3 - 15.4 % Final  . Platelets 08/21/2014 268  150 - 379 x10E3/uL Final  . Neutrophils 08/21/2014 56  % Final  . Lymphs 08/21/2014 31  % Final  . Monocytes 08/21/2014 8  % Final  . Eos 08/21/2014 4  % Final  . Basos 08/21/2014 1  % Final  . Neutrophils Absolute 08/21/2014 4.1  1.4 - 7.0 x10E3/uL Final  . Lymphocytes Absolute 08/21/2014 2.3  0.7 - 3.1 x10E3/uL Final  . Monocytes Absolute 08/21/2014 0.6  0.1 - 0.9 x10E3/uL Final  . EOS (ABSOLUTE) 08/21/2014 0.3  0.0 -  0.4 x10E3/uL Final  . Basophils Absolute 08/21/2014 0.0  0.0 - 0.2 x10E3/uL Final  . Immature Granulocytes 08/21/2014 0  % Final  . Immature Grans (Abs) 08/21/2014 0.0  0.0 - 0.1 x10E3/uL Final  . Glucose 08/21/2014 99  65 - 99 mg/dL Final  . BUN 08/21/2014 17  8 - 27 mg/dL Final  . Creatinine, Ser 08/21/2014 0.71  0.57 - 1.00 mg/dL Final  . GFR calc non Af Amer 08/21/2014 92  >59 mL/min/1.73 Final  . GFR calc Af Amer 08/21/2014 106  >59 mL/min/1.73 Final  . BUN/Creatinine Ratio 08/21/2014 24  11 - 26 Final  . Sodium 08/21/2014 145* 134 - 144 mmol/L Final  . Potassium 08/21/2014 4.5  3.5 - 5.2 mmol/L Final  . Chloride 08/21/2014 105  97 - 108 mmol/L Final  . CO2 08/21/2014 17* 18 - 29 mmol/L Final  . Calcium 08/21/2014 9.3  8.7 - 10.3 mg/dL Final  .  Total Protein 08/21/2014 6.2  6.0 - 8.5 g/dL Final  . Albumin 08/21/2014 4.5  3.6 - 4.8 g/dL Final  . Globulin, Total 08/21/2014 1.7  1.5 - 4.5 g/dL Final  . Albumin/Globulin Ratio 08/21/2014 2.6* 1.1 - 2.5 Final  . Bilirubin Total 08/21/2014 0.2  0.0 - 1.2 mg/dL Final  . Alkaline Phosphatase 08/21/2014 94  39 - 117 IU/L Final  . AST 08/21/2014 11  0 - 40 IU/L Final  . ALT 08/21/2014 12  0 - 32 IU/L Final  . CA 27.29 08/21/2014 24.1  0.0 - 38.6 U/mL Final    [facility] Labs: Labs on 08/14/2015 revealed a hematocrit of 42.4, hemoglobin 14.0, MCV 90, platelets 254,000, white count 7100 with an [place] of 4600. Comprehensive metabolic panel included a sodium of 142, potassium 4.4, BUN 17, creatinine 0.61, calcium 9.8, albumin 4.6, bilirubin 0.6, SGOT 11, SGPT 11, and alkaline phosphatase 102.   Assessment:  CATHALEYA DELAWDER is a 65 y.o. female with stage IIA right breast cancer s/p lumpectomy and axillary node dissection in 08/2004.  Pathology revealed a 3.2 cm invasive lobular carcinoma.  One of 13 lymph nodes were positive.  Tumor was ER positive and PR positive.  Pathologic stage was T2N1M0.  She received 6 cycles of Taxotere, Adriamycin, and  Cytoxan (TAC).  Chemotherapy completed in 12/2004.  She completed radiation in 03/2005.  She began Arimidex in 12/2004.  She switched to tamoxifen secondary to side effects (joint pain).  She stopped tamoxifen in 12/2009.  Mammogram on 07/16/2015 revealed no evidence of malignancy.  CA27.29 was 24.1.  CA27.29 was 32.1 on 12/28/2009, 29.7 on 12/28/2010, 24.7 on 07/07/2011 and 24.1 on 08/20/2014.  Bone density study on 06/15/2005 revealed osteopenia with a T score of -1.1 in the right hip.  Symptomatically, she denies any complaint.  Exam reveals post-operative changes.  Plan: 1.  Discuss entire medical history, diagnosis and management of breast cancer. 2.  Review [facility] labs.   3.  Slip for CA27.29. 4.  Slip for labs in 1 year (CBC with diff, CMP, CA27.29). 5.  Next mammogram due 07/15/2016. 6.  Review bone density study.  Discuss calcium and vitamin D. 7.  RTC in 1 year for MD assessment, review of [facility] labs, and mammogram.  Addendum:  CA27.29 was 27.8 (0-38.6) on 08/27/2015.   Lequita Asal, MD  08/27/2015, 10:38 AM

## 2015-08-27 ENCOUNTER — Ambulatory Visit: Payer: BLUE CROSS/BLUE SHIELD | Admitting: Oncology

## 2015-08-27 ENCOUNTER — Inpatient Hospital Stay: Payer: BLUE CROSS/BLUE SHIELD | Attending: Hematology and Oncology | Admitting: Hematology and Oncology

## 2015-08-27 VITALS — BP 138/89 | HR 69 | Temp 97.8°F | Resp 18 | Wt 138.6 lb

## 2015-08-27 DIAGNOSIS — Z9221 Personal history of antineoplastic chemotherapy: Secondary | ICD-10-CM | POA: Diagnosis not present

## 2015-08-27 DIAGNOSIS — Z9011 Acquired absence of right breast and nipple: Secondary | ICD-10-CM | POA: Insufficient documentation

## 2015-08-27 DIAGNOSIS — I10 Essential (primary) hypertension: Secondary | ICD-10-CM

## 2015-08-27 DIAGNOSIS — Z923 Personal history of irradiation: Secondary | ICD-10-CM | POA: Diagnosis not present

## 2015-08-27 DIAGNOSIS — Z803 Family history of malignant neoplasm of breast: Secondary | ICD-10-CM | POA: Insufficient documentation

## 2015-08-27 DIAGNOSIS — C50911 Malignant neoplasm of unspecified site of right female breast: Secondary | ICD-10-CM

## 2015-08-27 DIAGNOSIS — Z853 Personal history of malignant neoplasm of breast: Secondary | ICD-10-CM

## 2015-08-27 DIAGNOSIS — M8580 Other specified disorders of bone density and structure, unspecified site: Secondary | ICD-10-CM | POA: Insufficient documentation

## 2015-08-27 DIAGNOSIS — Z87891 Personal history of nicotine dependence: Secondary | ICD-10-CM | POA: Diagnosis not present

## 2015-08-27 NOTE — Progress Notes (Signed)
Patient is here for follow up, former [facility] patient.

## 2015-10-25 ENCOUNTER — Encounter: Payer: Self-pay | Admitting: Hematology and Oncology

## 2016-07-27 ENCOUNTER — Other Ambulatory Visit: Payer: Self-pay | Admitting: Hematology and Oncology

## 2016-07-27 ENCOUNTER — Ambulatory Visit
Admission: RE | Admit: 2016-07-27 | Discharge: 2016-07-27 | Disposition: A | Discharge status: Home / Self Care | Payer: BLUE CROSS/BLUE SHIELD | Source: Ambulatory Visit | Attending: Hematology and Oncology | Admitting: Hematology and Oncology

## 2016-07-27 DIAGNOSIS — C50911 Malignant neoplasm of unspecified site of right female breast: Secondary | ICD-10-CM

## 2016-07-27 DIAGNOSIS — Z1231 Encounter for screening mammogram for malignant neoplasm of breast: Secondary | ICD-10-CM | POA: Insufficient documentation

## 2016-08-26 ENCOUNTER — Inpatient Hospital Stay: Payer: BLUE CROSS/BLUE SHIELD | Attending: Hematology and Oncology | Admitting: Hematology and Oncology

## 2016-08-26 ENCOUNTER — Encounter: Payer: Self-pay | Admitting: Hematology and Oncology

## 2016-08-26 VITALS — BP 162/90 | HR 76 | Temp 97.0°F | Resp 18 | Wt 140.6 lb

## 2016-08-26 DIAGNOSIS — R232 Flushing: Secondary | ICD-10-CM | POA: Diagnosis not present

## 2016-08-26 DIAGNOSIS — Z923 Personal history of irradiation: Secondary | ICD-10-CM | POA: Diagnosis not present

## 2016-08-26 DIAGNOSIS — Z9221 Personal history of antineoplastic chemotherapy: Secondary | ICD-10-CM

## 2016-08-26 DIAGNOSIS — M8580 Other specified disorders of bone density and structure, unspecified site: Secondary | ICD-10-CM | POA: Diagnosis not present

## 2016-08-26 DIAGNOSIS — C50911 Malignant neoplasm of unspecified site of right female breast: Secondary | ICD-10-CM

## 2016-08-26 DIAGNOSIS — Z9011 Acquired absence of right breast and nipple: Secondary | ICD-10-CM | POA: Diagnosis not present

## 2016-08-26 DIAGNOSIS — Z170 Estrogen receptor positive status [ER+]: Secondary | ICD-10-CM

## 2016-08-26 DIAGNOSIS — Z87891 Personal history of nicotine dependence: Secondary | ICD-10-CM | POA: Diagnosis not present

## 2016-08-26 DIAGNOSIS — I10 Essential (primary) hypertension: Secondary | ICD-10-CM | POA: Insufficient documentation

## 2016-08-26 DIAGNOSIS — Z79899 Other long term (current) drug therapy: Secondary | ICD-10-CM | POA: Insufficient documentation

## 2016-08-26 DIAGNOSIS — Z803 Family history of malignant neoplasm of breast: Secondary | ICD-10-CM | POA: Insufficient documentation

## 2016-08-26 DIAGNOSIS — Z853 Personal history of malignant neoplasm of breast: Secondary | ICD-10-CM | POA: Diagnosis not present

## 2016-08-26 NOTE — Progress Notes (Signed)
[facility] day:  08/26/16  Chief Complaint: Natalie Monroe is a 66 y.o. female with stage IIA right breast cancer who is seen for 1 year assessment.  HPI:   The patient was last seen in the medical oncology clinic on 08/27/2015.  At that time, she was seen for initial assessment.  She denied any complaint.  Exam revealed post-operative changes.  Mammogram on 07/27/2016 revealed no evidence of malignancy.  During the interim, she has done well.  She had some vertigo x 3 weeks in the spring.  She states that her symptoms resolved after balancing her chrystals.  She denies any breast concerns.  She still has hot flashes.   Past Medical History:  Diagnosis Date  . Breast cancer [facility]) 2006   right breast, chemo and radiation  . Hypertension     Past Surgical History:  Procedure Laterality Date  . BREAST BIOPSY Bilateral 2006   left negative right positive  . BREAST LUMPECTOMY Right 2006   positive  . MASTECTOMY, PARTIAL Right     Family History  Problem Relation Age of Onset  . Breast cancer Maternal Aunt 70    Social History:  reports that she has quit smoking. She does not have any smokeless tobacco history on file. Her alcohol and drug histories are not on file.  She works in a Technical [employer].  She lives in [place].  The patient is alone today.  Allergies: No Known Allergies  Current Medications: Current Outpatient Prescriptions  Medication Sig Dispense Refill  . Aspirin-Acetaminophen-Caffeine (EXCEDRIN PO) Take by mouth.    Marland Kitchen atorvastatin (LIPITOR) 20 MG tablet Take 20 mg by mouth daily.  5  . Calcium Carb-Cholecalciferol (CALCIUM-VITAMIN D) 500-200 MG-UNIT tablet Take 1 tablet by mouth daily.    . Coenzyme Q10 (COQ10) 100 MG CAPS Take 100 mg by mouth daily.    . metoprolol succinate (TOPROL-XL) 100 MG 24 hr tablet Take 100 mg by mouth daily.     Marland Kitchen NAPROXEN PO Take by mouth.    . Probiotic Product (PROBIOTIC ADVANCED  PO) Take 1 tablet by mouth daily.    . SUMAtriptan (IMITREX) 100 MG tablet TAKE 1 TABLET BY MOUTH AS DIRECTED AS NEEDED MAY REPEAT IN 2 HOURS IFNEEDED    . triamcinolone cream (KENALOG) 0.1 % APPLY SPARINGLY AND RUB IN WELL TO AFFECTED AREAS DAILY OR USE AS DIRECTED    . valACYclovir (VALTREX) 1000 MG tablet Take 2,000 mg by mouth.    . vitamin B-12 (CYANOCOBALAMIN) 1000 MCG tablet Take 500 mcg by mouth daily.     No current facility-administered medications for this visit.     Review of Systems:  GENERAL:  Feels good. No fevers or sweats.  Weight gain of 2 pounds. PERFORMANCE STATUS (ECOG): 0 HEENT:  No visual changes, runny nose, sore throat, mouth sores or tenderness. Lungs: No shortness of breath or cough.  No hemoptysis. Cardiac:  No chest pain, palpitations, orthopnea, or PND. GI:  No nausea, vomiting, diarrhea, constipation, melena or hematochezia. GU:  No urgency, frequency, dysuria, or hematuria. Musculoskeletal:  No back pain.  No joint pain.  No muscle tenderness. Extremities:  No pain or swelling. Skin:  No rashes or skin changes. Neuro:  Interval vertigo (resolved).  No headache, numbness or weakness, balance or coordination issues. Endocrine:  No diabetes, thyroid issues or night sweats.  Hot flashes. Psych:  No mood changes, depression or anxiety. Pain:  No focal pain. Review of  systems:  All other systems reviewed and found to be negative.  Physical Exam: Blood pressure (!) 162/90, pulse 76, temperature (!) 97 F (36.1 C), temperature source Tympanic, resp. rate 18, weight 140 lb 9 oz (63.8 kg). GENERAL:  Well developed, well nourished, woman sitting comfortably in the exam room in no acute distress. MENTAL STATUS:  Alert and oriented to person, place and time. HEAD:  Shoulder length blonde hair.  Normocephalic, atraumatic, face symmetric, no Cushingoid features. EYES:  Glasses.  Blue eyes.  Pupils equal round and reactive to light and accomodation.  No conjunctivitis  or scleral icterus. ENT:  Oropharynx clear without lesion.  Tongue normal. Mucous membranes moist.  RESPIRATORY:  Clear to auscultation without rales, wheezes or rhonchi. CARDIOVASCULAR:  Regular rate and rhythm without murmur, rub or gallop. BREAST:  Right breast s/p lumpectomy with well healed lateral semicircular incision.  No masses, skin changes or nipple discharge.  Left breast with fibrocystic changes.  No masses, skin changes or nipple discharge.  ABDOMEN:  Soft, non-tender, with active bowel sounds, and no hepatosplenomegaly.  No masses. SKIN:  No rashes, ulcers or lesions. EXTREMITIES: No edema, no skin discoloration or tenderness.  No palpable cords. LYMPH NODES: No palpable cervical, supraclavicular, axillary or inguinal adenopathy  NEUROLOGICAL: Unremarkable. PSYCH:  Appropriate.   No visits with results within 3 Day(s) from this visit.  Latest known visit with results is:  Orders Only on 08/20/2014  Component Date Value Ref Range Status  . WBC 08/20/2014 7.4  3.4 - 10.8 x10E3/uL Final  . RBC 08/20/2014 4.72  3.77 - 5.28 x10E6/uL Final  . Hemoglobin 08/20/2014 14.0  11.1 - 15.9 g/dL Final  . Hematocrit 08/20/2014 40.9  34.0 - 46.6 % Final  . MCV 08/20/2014 87  79 - 97 fL Final  . MCH 08/20/2014 29.7  26.6 - 33.0 pg Final  . MCHC 08/20/2014 34.2  31.5 - 35.7 g/dL Final  . RDW 08/20/2014 13.6  12.3 - 15.4 % Final  . Platelets 08/20/2014 268  150 - 379 x10E3/uL Final  . Neutrophils 08/20/2014 56  % Final  . Lymphs 08/20/2014 31  % Final  . Monocytes 08/20/2014 8  % Final  . Eos 08/20/2014 4  % Final  . Basos 08/20/2014 1  % Final  . Neutrophils Absolute 08/20/2014 4.1  1.4 - 7.0 x10E3/uL Final  . Lymphocytes Absolute 08/20/2014 2.3  0.7 - 3.1 x10E3/uL Final  . Monocytes Absolute 08/20/2014 0.6  0.1 - 0.9 x10E3/uL Final  . EOS (ABSOLUTE) 08/20/2014 0.3  0.0 - 0.4 x10E3/uL Final  . Basophils Absolute 08/20/2014 0.0  0.0 - 0.2 x10E3/uL Final  . Immature Granulocytes  08/20/2014 0  % Final  . Immature Grans (Abs) 08/20/2014 0.0  0.0 - 0.1 x10E3/uL Final  . Glucose 08/20/2014 99  65 - 99 mg/dL Final  . BUN 08/20/2014 17  8 - 27 mg/dL Final  . Creatinine, Ser 08/20/2014 0.71  0.57 - 1.00 mg/dL Final  . GFR calc non Af Amer 08/20/2014 92  >59 mL/min/1.73 Final  . GFR calc Af Amer 08/20/2014 106  >59 mL/min/1.73 Final  . BUN/Creatinine Ratio 08/20/2014 24  11 - 26 Final  . Sodium 08/20/2014 145* 134 - 144 mmol/L Final  . Potassium 08/20/2014 4.5  3.5 - 5.2 mmol/L Final  . Chloride 08/20/2014 105  97 - 108 mmol/L Final  . CO2 08/20/2014 17* 18 - 29 mmol/L Final  . Calcium 08/20/2014 9.3  8.7 - 10.3 mg/dL Final  .  Total Protein 08/20/2014 6.2  6.0 - 8.5 g/dL Final  . Albumin 08/20/2014 4.5  3.6 - 4.8 g/dL Final  . Globulin, Total 08/20/2014 1.7  1.5 - 4.5 g/dL Final  . Albumin/Globulin Ratio 08/20/2014 2.6* 1.1 - 2.5 Final  . Bilirubin Total 08/20/2014 0.2  0.0 - 1.2 mg/dL Final  . Alkaline Phosphatase 08/20/2014 94  39 - 117 IU/L Final  . AST 08/20/2014 11  0 - 40 IU/L Final  . ALT 08/20/2014 12  0 - 32 IU/L Final  . CA 27.29 08/20/2014 24.1  0.0 - 38.6 U/mL Final   [facility]/ACS methodology    [facility] Labs: Labs on 08/14/2015 revealed a hematocrit of 42.4, hemoglobin 14.0, MCV 90, platelets 254,000, white count 7100 with an [place] of 4600. Comprehensive metabolic panel included a sodium of 142, potassium 4.4, BUN 17, creatinine 0.61, calcium 9.8, albumin 4.6, bilirubin 0.6, SGOT 11, SGPT 11, and alkaline phosphatase 102. Labs on 07/272018 revealed a hematocrit of 39.8, hemoglobin 13.3, MCV 87, platelets 265,000, white count 7200 with an [place] of 4200. Comprehensive metabolic panel included a sodium of 144, potassium 4.4, BUN 18, creatinine 0.58, calcium 9.5, albumin 4.5, bilirubin 0.6, SGOT 13, SGPT 13, and alkaline phosphatase 78.   Assessment:  DARETH ANDREW is a 66 y.o. female with stage IIA right breast cancer s/p lumpectomy and axillary node  dissection in 08/2004.  Pathology revealed a 3.2 cm invasive lobular carcinoma.  One of 13 lymph nodes were positive.  Tumor was ER positive and PR positive.  Pathologic stage was T2N1M0.  She received 6 cycles of Taxotere, Adriamycin, and Cytoxan (TAC).  Chemotherapy completed in 12/2004.  She completed radiation in 03/2005.  She began Arimidex in 12/2004.  She switched to tamoxifen secondary to side effects (joint pain).  She stopped tamoxifen in 12/2009.  Mammogram on 07/27/2016 revealed no evidence of malignancy.   CA27.29 was 32.1 on 12/28/2009, 29.7 on 12/28/2010, 24.7 on 07/07/2011, 24.1 on 08/20/2014, and 27.8 on 08/27/2015.  Bone density study on 06/15/2005 revealed osteopenia with a T score of -1.1 in the right hip.  Bone density study on 10/29/2015 revealed osteopenia with a T score of -1.1 in the left femoral neck.  Symptomatically, she notes some hot flashes.  Exam reveals stable post-operative changes.  Plan: 1.  Review [facility] labs.  Discussed with patient missing CA27.29  As she is > 10 years out, will defer this year.  Patient in agreement. 2.  Review interval mammogram.  No evidence of malignancy. 3.  Discuss interval bone density- osteopenia.  Continue calcium and vitamin D. 4.  Schedule bilateral mammogram 07/27/2017. 5.  Slip for labs in 1 year (CBC with diff, CMP, CA27.29). 6.  Patient interested in reconstructive surgery.  Plan to discuss further at next year with insurance change. 7.  RTC in 1 year for MD assessment, review of [facility] labs and mammogram.   Lequita Asal, MD  08/26/2016, 10:44 AM

## 2016-08-26 NOTE — Progress Notes (Signed)
Patient here today for 1 year follow up regarding breast cancer.

## 2016-08-27 ENCOUNTER — Encounter: Payer: Self-pay | Admitting: Hematology and Oncology

## 2016-09-05 ENCOUNTER — Encounter: Payer: Self-pay | Admitting: Hematology and Oncology

## 2016-11-21 ENCOUNTER — Encounter: Payer: Self-pay | Admitting: Hematology and Oncology

## 2017-07-28 ENCOUNTER — Ambulatory Visit
Admission: RE | Admit: 2017-07-28 | Discharge: 2017-07-28 | Disposition: A | Discharge status: Home / Self Care | Payer: Medicare Other | Source: Ambulatory Visit | Attending: Hematology and Oncology | Admitting: Hematology and Oncology

## 2017-07-28 DIAGNOSIS — Z853 Personal history of malignant neoplasm of breast: Secondary | ICD-10-CM | POA: Insufficient documentation

## 2017-07-28 DIAGNOSIS — Z170 Estrogen receptor positive status [ER+]: Secondary | ICD-10-CM

## 2017-07-28 DIAGNOSIS — C50911 Malignant neoplasm of unspecified site of right female breast: Secondary | ICD-10-CM

## 2017-07-28 DIAGNOSIS — Z1231 Encounter for screening mammogram for malignant neoplasm of breast: Secondary | ICD-10-CM | POA: Insufficient documentation

## 2017-07-28 HISTORY — DX: Personal history of irradiation: Z92.3

## 2017-07-28 HISTORY — DX: Personal history of antineoplastic chemotherapy: Z92.21

## 2017-08-03 ENCOUNTER — Inpatient Hospital Stay: Payer: Medicare Other | Attending: Hematology and Oncology | Admitting: Urgent Care

## 2017-08-03 ENCOUNTER — Other Ambulatory Visit: Payer: Self-pay | Admitting: *Deleted

## 2017-08-03 VITALS — BP 135/85 | HR 87 | Temp 97.0°F | Resp 18 | Wt 134.0 lb

## 2017-08-03 DIAGNOSIS — Z853 Personal history of malignant neoplasm of breast: Secondary | ICD-10-CM | POA: Diagnosis not present

## 2017-08-03 DIAGNOSIS — Z170 Estrogen receptor positive status [ER+]: Secondary | ICD-10-CM

## 2017-08-03 DIAGNOSIS — Z87891 Personal history of nicotine dependence: Secondary | ICD-10-CM | POA: Insufficient documentation

## 2017-08-03 DIAGNOSIS — M8580 Other specified disorders of bone density and structure, unspecified site: Secondary | ICD-10-CM | POA: Diagnosis not present

## 2017-08-03 DIAGNOSIS — C50911 Malignant neoplasm of unspecified site of right female breast: Secondary | ICD-10-CM

## 2017-08-03 NOTE — Progress Notes (Signed)
Patient offers no complaints today. 

## 2017-08-03 NOTE — Progress Notes (Signed)
[facility] day:  08/03/17  Chief Complaint: Natalie Monroe is a 67 y.o. female with stage IIA right breast cancer who is seen for 1 year assessment.  HPI:   The patient was last seen in the medical oncology clinic on 08/26/2016.  At that time, patient was doing well.  She had no acute complaints.  She noted a 3-week episode of vertigo back in the spring that resolved once she had her "crystals balanced".  Patient denied any breast complaints or significant infections. she continued to experience vasomotor symptoms. Exam revealed stable post-operative changes.  Lab core labs reviewed.  Routine mammogram done on 07/28/2017 that revealed no mammographic evidence of malignancy in either breast.  In the interim, patient has been doing well. She denies any significant changes to her health history over the course of the last year. Previously reported vertigo improved as her "crystals were balanced". She recently received an injection in her RIGHT wrist for her known carpel tunnel symptoms.   Patient denies that she has experienced any B symptoms. She denies any recurrent infections.  Patient does not verbalize any concerns with regards to her breasts today. Patient performs monthly self breast examinations as recommended.   Patient advises that she maintains an adequate appetite. She is eating well. Weight today is 134 lb (60.8 kg), which compared to her last visit to the clinic, represents a  6 pound decrease.   Patient denies pain in the clinic today.  Past Medical History:  Diagnosis Date  . Breast cancer [facility]) 2006   right breast, chemo and radiation  . Hypertension   . Personal history of chemotherapy   . Personal history of radiation therapy     Past Surgical History:  Procedure Laterality Date  . ABDOMINAL HYSTERECTOMY    . BREAST BIOPSY Bilateral 2006   left negative right positive  . BREAST LUMPECTOMY Right 2006   positive  .  MASTECTOMY, PARTIAL Right   . OOPHORECTOMY      Family History  Problem Relation Age of Onset  . Breast cancer Maternal Aunt 70    Social History:  reports that she has quit smoking. She has never used smokeless tobacco. Her alcohol and drug histories are not on file.  She works in a [employer].  She lives in [place].  The patient is alone today.  Allergies: No Known Allergies  Current Medications: Current Outpatient Medications  Medication Sig Dispense Refill  . Aspirin-Acetaminophen-Caffeine (EXCEDRIN PO) Take by mouth.    Marland Kitchen atorvastatin (LIPITOR) 20 MG tablet Take 20 mg by mouth daily.  5  . Calcium Carb-Cholecalciferol (CALCIUM-VITAMIN D) 500-200 MG-UNIT tablet Take 1 tablet by mouth daily.    . Coenzyme Q10 (COQ10) 100 MG CAPS Take 100 mg by mouth daily.    . metoprolol succinate (TOPROL-XL) 100 MG 24 hr tablet Take 100 mg by mouth daily.     Marland Kitchen NAPROXEN PO Take by mouth.    . Probiotic Product (PROBIOTIC ADVANCED PO) Take 1 tablet by mouth daily.    . SUMAtriptan (IMITREX) 100 MG tablet TAKE 1 TABLET BY MOUTH AS DIRECTED AS NEEDED MAY REPEAT IN 2 HOURS IFNEEDED    . triamcinolone cream (KENALOG) 0.1 % APPLY SPARINGLY AND RUB IN WELL TO AFFECTED AREAS DAILY OR USE AS DIRECTED    . valACYclovir (VALTREX) 1000 MG tablet Take 2,000 mg by mouth.    . vitamin B-12 (CYANOCOBALAMIN) 1000 MCG tablet Take 500 mcg by mouth  daily.     No current facility-administered medications for this visit.     Review of Systems  Constitutional: Negative for diaphoresis, fever, malaise/fatigue and weight loss.       "Im doing well". Active.   HENT: Negative.   Eyes: Negative.   Respiratory: Negative for cough, hemoptysis, sputum production and shortness of breath.   Cardiovascular: Negative for chest pain, palpitations, orthopnea, leg swelling and PND.  Gastrointestinal: Negative for abdominal pain, blood in stool, constipation, diarrhea, melena, nausea and vomiting.  Genitourinary:  Negative for dysuria, frequency, hematuria and urgency.  Musculoskeletal: Negative for back pain, falls, joint pain and myalgias.  Skin: Negative for itching and rash.  Neurological: Positive for dizziness (at times). Negative for tremors, weakness and headaches.  Endo/Heme/Allergies: Does not bruise/bleed easily.  Psychiatric/Behavioral: Negative for depression, memory loss and suicidal ideas. The patient is not nervous/anxious and does not have insomnia.   All other systems reviewed and are negative.  Performance status (ECOG): 0 - Asymptomatic  Vital Signs BP 135/85 (BP Location: Left Arm, Patient Position: Sitting)   Pulse 87   Temp (!) 97 F (36.1 C) (Tympanic)   Resp 18   Wt 134 lb (60.8 kg)   Physical Exam  Constitutional: She is oriented to person, place, and time and well-developed, well-nourished, and in no distress.  HENT:  Head: Normocephalic and atraumatic.  Blonde hair  Eyes: Pupils are equal, round, and reactive to light. EOM are normal. No scleral icterus.  Glasses. Blue eyes.   Neck: Normal range of motion. Neck supple. No tracheal deviation present. No thyromegaly present.  Cardiovascular: Normal rate, regular rhythm and normal heart sounds. Exam reveals no gallop and no friction rub.  No murmur heard. Pulmonary/Chest: Effort normal and breath sounds normal. No respiratory distress. She has no wheezes. She has no rales. Right breast exhibits skin change (s/p lumpectomy with well healed semicircular incision). Right breast exhibits no mass and no nipple discharge. Left breast exhibits mass and skin change (fibrocystic changes). Left breast exhibits no nipple discharge.  Abdominal: Soft. Bowel sounds are normal. She exhibits no distension. There is no tenderness.  Musculoskeletal: Normal range of motion. She exhibits no edema or tenderness.  Lymphadenopathy:    She has no cervical adenopathy.    She has no axillary adenopathy.       Right: No inguinal and no  supraclavicular adenopathy present.       Left: No inguinal and no supraclavicular adenopathy present.  Neurological: She is alert and oriented to person, place, and time.  Skin: Skin is warm and dry. No rash noted. No erythema.  Psychiatric: Mood, affect and judgment normal.  Nursing note and vitals reviewed.   No visits with results within 3 Day(s) from this visit.  Latest known visit with results is:  Orders Only on 08/20/2014  Component Date Value Ref Range Status  . WBC 08/20/2014 7.4  3.4 - 10.8 x10E3/uL Final  . RBC 08/20/2014 4.72  3.77 - 5.28 x10E6/uL Final  . Hemoglobin 08/20/2014 14.0  11.1 - 15.9 g/dL Final  . Hematocrit 08/20/2014 40.9  34.0 - 46.6 % Final  . MCV 08/20/2014 87  79 - 97 fL Final  . MCH 08/20/2014 29.7  26.6 - 33.0 pg Final  . MCHC 08/20/2014 34.2  31.5 - 35.7 g/dL Final  . RDW 08/20/2014 13.6  12.3 - 15.4 % Final  . Platelets 08/20/2014 268  150 - 379 x10E3/uL Final  . Neutrophils 08/20/2014 56  % Final  .  Lymphs 08/20/2014 31  % Final  . Monocytes 08/20/2014 8  % Final  . Eos 08/20/2014 4  % Final  . Basos 08/20/2014 1  % Final  . Neutrophils Absolute 08/20/2014 4.1  1.4 - 7.0 x10E3/uL Final  . Lymphocytes Absolute 08/20/2014 2.3  0.7 - 3.1 x10E3/uL Final  . Monocytes Absolute 08/20/2014 0.6  0.1 - 0.9 x10E3/uL Final  . EOS (ABSOLUTE) 08/20/2014 0.3  0.0 - 0.4 x10E3/uL Final  . Basophils Absolute 08/20/2014 0.0  0.0 - 0.2 x10E3/uL Final  . Immature Granulocytes 08/20/2014 0  % Final  . Immature Grans (Abs) 08/20/2014 0.0  0.0 - 0.1 x10E3/uL Final  . Glucose 08/20/2014 99  65 - 99 mg/dL Final  . BUN 08/20/2014 17  8 - 27 mg/dL Final  . Creatinine, Ser 08/20/2014 0.71  0.57 - 1.00 mg/dL Final  . GFR calc non Af Amer 08/20/2014 92  >59 mL/min/1.73 Final  . GFR calc Af Amer 08/20/2014 106  >59 mL/min/1.73 Final  . BUN/Creatinine Ratio 08/20/2014 24  11 - 26 Final  . Sodium 08/20/2014 145* 134 - 144 mmol/L Final  . Potassium 08/20/2014 4.5  3.5 - 5.2  mmol/L Final  . Chloride 08/20/2014 105  97 - 108 mmol/L Final  . CO2 08/20/2014 17* 18 - 29 mmol/L Final  . Calcium 08/20/2014 9.3  8.7 - 10.3 mg/dL Final  . Total Protein 08/20/2014 6.2  6.0 - 8.5 g/dL Final  . Albumin 08/20/2014 4.5  3.6 - 4.8 g/dL Final  . Globulin, Total 08/20/2014 1.7  1.5 - 4.5 g/dL Final  . Albumin/Globulin Ratio 08/20/2014 2.6* 1.1 - 2.5 Final  . Bilirubin Total 08/20/2014 0.2  0.0 - 1.2 mg/dL Final  . Alkaline Phosphatase 08/20/2014 94  39 - 117 IU/L Final  . AST 08/20/2014 11  0 - 40 IU/L Final  . ALT 08/20/2014 12  0 - 32 IU/L Final  . CA 27.29 08/20/2014 24.1  0.0 - 38.6 U/mL Final   Bayer Centaur/ACS methodology    [facility] Labs: Labs on 08/14/2015 revealed a hematocrit of 42.4, hemoglobin 14.0, MCV 90, platelets 254,000, white count 7100 with an [place] of 4600. Comprehensive metabolic panel included a sodium of 142, potassium 4.4, BUN 17, creatinine 0.61, calcium 9.8, albumin 4.6, bilirubin 0.6, SGOT 11, SGPT 11, and alkaline phosphatase 102. Labs on 08/12/2016 revealed a hematocrit of 39.8, hemoglobin 13.3, MCV 87, platelets 265,000, white count 7200 with an ANC of 4200. Comprehensive metabolic panel included a sodium of 144, potassium 4.4, BUN 18, creatinine 0.58, calcium 9.5, albumin 4.5, bilirubin 0.6, SGOT 13, SGPT 13, and alkaline phosphatase 78. Labs on 07/25/2017 revealed a hematocrit of 41.4, hemoglobin 13.9, MCV 88, platelets 268,000, white count 6900 with an ANC of 4200. Comprehensive metabolic panel included a sodium of 145, potassium 4.1, BUN 15, creatinine 0.92, calcium 9.7, albumin 4.6, bilirubin 0.6, SGOT 15, SGPT 14, and alkaline phosphatase 83. CA27.29 was 22.5.   Assessment:  Natalie Monroe is a 67 y.o. female with stage IIA right breast cancer s/p lumpectomy and axillary node dissection in 08/2004.  Pathology revealed a 3.2 cm invasive lobular carcinoma.  One of 13 lymph nodes were positive.  Tumor was ER positive and PR positive.  Pathologic  stage was T2N1M0.  She received 6 cycles of Taxotere, Adriamycin, and Cytoxan (TAC).  Chemotherapy completed in 12/2004.  She completed radiation in 03/2005.  She began Arimidex in 12/2004.  She switched to tamoxifen secondary to side effects (joint pain).  She  stopped tamoxifen in 12/2009.  Mammogram on 07/27/2016 revealed no evidence of malignancy.  Bilateral screening mammogram on 07/28/2017 revealed no mammographic evidence of malignancy in either breast.  CA27.29 was 32.1 on 12/28/2009, 29.7 on 12/28/2010, 24.7 on 07/07/2011, 24.1 on 08/20/2014, 27.8 on 08/27/2015, and 22.5 on 07/25/2017.   Bone density study on 06/15/2005 revealed osteopenia with a T score of -1.1 in the right hip.  Bone density study on 10/29/2015 revealed osteopenia with a T score of -1.1 in the left femoral neck.  Symptomatically, patient is doing well. She has no acute complaints. No breast concerns or B symptoms. Exam reveals stable post-operative changes. [facility] labs reviewed.   Plan: 1. Review [facility] labs - all are unremarkable.  2. Discuss interval mammogram - no evidence of malignancy.  3. Discuss history of osteopenia. Bone density due in October - will scheduled. Encouraged her to take calcium 1200 mg and vitamin D 800 IU daily.  4. Schedule annual mammogram for 07/30/2018.  5. RTC in 1 year for MD assessment, labs (CBC with diff, CMP, CA27.29) and review of mammogram. Patient has changed insurances and wishes to have labs done at [facility] rather than at [facility], NP  08/03/2017, 12:59 PM

## 2017-10-30 ENCOUNTER — Ambulatory Visit
Admission: RE | Admit: 2017-10-30 | Discharge: 2017-10-30 | Disposition: A | Discharge status: Home / Self Care | Payer: Medicare Other | Source: Ambulatory Visit | Attending: Urgent Care | Admitting: Urgent Care

## 2017-10-30 DIAGNOSIS — Z13820 Encounter for screening for osteoporosis: Secondary | ICD-10-CM | POA: Insufficient documentation

## 2017-10-30 DIAGNOSIS — Z853 Personal history of malignant neoplasm of breast: Secondary | ICD-10-CM | POA: Diagnosis not present

## 2017-10-30 DIAGNOSIS — Z170 Estrogen receptor positive status [ER+]: Secondary | ICD-10-CM

## 2017-10-30 DIAGNOSIS — M85851 Other specified disorders of bone density and structure, right thigh: Secondary | ICD-10-CM | POA: Diagnosis not present

## 2017-10-30 DIAGNOSIS — C50911 Malignant neoplasm of unspecified site of right female breast: Secondary | ICD-10-CM

## 2017-10-30 DIAGNOSIS — M8580 Other specified disorders of bone density and structure, unspecified site: Secondary | ICD-10-CM

## 2017-10-30 DIAGNOSIS — Z780 Asymptomatic menopausal state: Secondary | ICD-10-CM | POA: Diagnosis not present

## 2017-11-08 ENCOUNTER — Encounter: Payer: Self-pay | Admitting: Urgent Care

## 2018-02-15 ENCOUNTER — Other Ambulatory Visit: Payer: Self-pay | Admitting: Urology

## 2018-02-15 ENCOUNTER — Other Ambulatory Visit (HOSPITAL_COMMUNITY): Payer: Self-pay | Admitting: Urology

## 2018-02-15 DIAGNOSIS — R1011 Right upper quadrant pain: Secondary | ICD-10-CM

## 2018-02-15 DIAGNOSIS — N200 Calculus of kidney: Secondary | ICD-10-CM

## 2018-02-19 ENCOUNTER — Other Ambulatory Visit: Payer: Self-pay | Admitting: Urology

## 2018-02-19 DIAGNOSIS — N200 Calculus of kidney: Secondary | ICD-10-CM

## 2018-02-19 DIAGNOSIS — R110 Nausea: Secondary | ICD-10-CM

## 2018-02-19 DIAGNOSIS — R109 Unspecified abdominal pain: Secondary | ICD-10-CM

## 2018-02-19 DIAGNOSIS — R1011 Right upper quadrant pain: Secondary | ICD-10-CM

## 2018-02-20 ENCOUNTER — Ambulatory Visit
Admission: RE | Admit: 2018-02-20 | Discharge: 2018-02-20 | Disposition: A | Discharge status: Home / Self Care | Payer: Medicare Other | Source: Ambulatory Visit | Attending: Urology | Admitting: Urology

## 2018-02-20 DIAGNOSIS — N200 Calculus of kidney: Secondary | ICD-10-CM | POA: Diagnosis present

## 2018-02-20 DIAGNOSIS — R109 Unspecified abdominal pain: Secondary | ICD-10-CM | POA: Insufficient documentation

## 2018-02-20 DIAGNOSIS — R1011 Right upper quadrant pain: Secondary | ICD-10-CM | POA: Insufficient documentation

## 2018-02-20 DIAGNOSIS — R110 Nausea: Secondary | ICD-10-CM | POA: Insufficient documentation

## 2018-05-29 DIAGNOSIS — R42 Dizziness and giddiness: Secondary | ICD-10-CM | POA: Insufficient documentation

## 2018-07-31 ENCOUNTER — Other Ambulatory Visit: Payer: Self-pay

## 2018-07-31 ENCOUNTER — Ambulatory Visit
Admission: RE | Admit: 2018-07-31 | Discharge: 2018-07-31 | Disposition: A | Discharge status: Home / Self Care | Payer: Medicare Other | Source: Ambulatory Visit | Attending: Urgent Care | Admitting: Urgent Care

## 2018-07-31 DIAGNOSIS — Z170 Estrogen receptor positive status [ER+]: Secondary | ICD-10-CM | POA: Insufficient documentation

## 2018-07-31 DIAGNOSIS — C50911 Malignant neoplasm of unspecified site of right female breast: Secondary | ICD-10-CM | POA: Diagnosis not present

## 2018-07-31 DIAGNOSIS — Z1231 Encounter for screening mammogram for malignant neoplasm of breast: Secondary | ICD-10-CM | POA: Insufficient documentation

## 2018-08-02 ENCOUNTER — Ambulatory Visit: Payer: PRIVATE HEALTH INSURANCE | Admitting: Hematology and Oncology

## 2018-08-02 ENCOUNTER — Other Ambulatory Visit: Payer: PRIVATE HEALTH INSURANCE

## 2018-08-02 ENCOUNTER — Ambulatory Visit: Payer: PRIVATE HEALTH INSURANCE | Admitting: Oncology

## 2018-08-03 ENCOUNTER — Inpatient Hospital Stay (HOSPITAL_BASED_OUTPATIENT_CLINIC_OR_DEPARTMENT_OTHER): Payer: Medicare Other | Admitting: Oncology

## 2018-08-03 ENCOUNTER — Other Ambulatory Visit: Payer: Self-pay

## 2018-08-03 ENCOUNTER — Inpatient Hospital Stay: Payer: Medicare Other | Attending: Oncology

## 2018-08-03 ENCOUNTER — Encounter: Payer: Self-pay | Admitting: Oncology

## 2018-08-03 VITALS — BP 132/90 | HR 69 | Temp 95.3°F | Wt 133.8 lb

## 2018-08-03 DIAGNOSIS — Z853 Personal history of malignant neoplasm of breast: Secondary | ICD-10-CM

## 2018-08-03 DIAGNOSIS — Z87891 Personal history of nicotine dependence: Secondary | ICD-10-CM

## 2018-08-03 DIAGNOSIS — M545 Low back pain: Secondary | ICD-10-CM | POA: Diagnosis not present

## 2018-08-03 DIAGNOSIS — M8580 Other specified disorders of bone density and structure, unspecified site: Secondary | ICD-10-CM

## 2018-08-03 DIAGNOSIS — Z170 Estrogen receptor positive status [ER+]: Secondary | ICD-10-CM

## 2018-08-03 DIAGNOSIS — M8588 Other specified disorders of bone density and structure, other site: Secondary | ICD-10-CM

## 2018-08-03 DIAGNOSIS — C50911 Malignant neoplasm of unspecified site of right female breast: Secondary | ICD-10-CM

## 2018-08-03 DIAGNOSIS — G8929 Other chronic pain: Secondary | ICD-10-CM

## 2018-08-03 LAB — CBC WITH DIFFERENTIAL/PLATELET
Abs Immature Granulocytes: 0.01 10*3/uL (ref 0.00–0.07)
Basophils Absolute: 0 10*3/uL (ref 0.0–0.1)
Basophils Relative: 1 %
Eosinophils Absolute: 0.2 10*3/uL (ref 0.0–0.5)
Eosinophils Relative: 3 %
HCT: 40 % (ref 36.0–46.0)
Hemoglobin: 13.4 g/dL (ref 12.0–15.0)
Immature Granulocytes: 0 %
Lymphocytes Relative: 33 %
Lymphs Abs: 2.2 10*3/uL (ref 0.7–4.0)
MCH: 29.8 pg (ref 26.0–34.0)
MCHC: 33.5 g/dL (ref 30.0–36.0)
MCV: 89.1 fL (ref 80.0–100.0)
Monocytes Absolute: 0.5 10*3/uL (ref 0.1–1.0)
Monocytes Relative: 7 %
Neutro Abs: 3.6 10*3/uL (ref 1.7–7.7)
Neutrophils Relative %: 56 %
Platelets: 230 10*3/uL (ref 150–400)
RBC: 4.49 MIL/uL (ref 3.87–5.11)
RDW: 12.7 % (ref 11.5–15.5)
WBC: 6.5 10*3/uL (ref 4.0–10.5)
nRBC: 0 % (ref 0.0–0.2)

## 2018-08-03 LAB — COMPREHENSIVE METABOLIC PANEL
ALT: 17 U/L (ref 0–44)
AST: 18 U/L (ref 15–41)
Albumin: 4.4 g/dL (ref 3.5–5.0)
Alkaline Phosphatase: 76 U/L (ref 38–126)
Anion gap: 9 (ref 5–15)
BUN: 15 mg/dL (ref 8–23)
CO2: 26 mmol/L (ref 22–32)
Calcium: 9.6 mg/dL (ref 8.9–10.3)
Chloride: 109 mmol/L (ref 98–111)
Creatinine, Ser: 0.75 mg/dL (ref 0.44–1.00)
GFR calc Af Amer: >60 mL/min (ref >60)
GFR calc non Af Amer: >60 mL/min (ref >60)
Glucose, Bld: 105 mg/dL — ABNORMAL HIGH (ref 70–99)
Potassium: 4.3 mmol/L (ref 3.5–5.1)
Sodium: 144 mmol/L (ref 135–145)
Total Bilirubin: 0.8 mg/dL (ref 0.3–1.2)
Total Protein: 7.1 g/dL (ref 6.5–8.1)

## 2018-08-04 LAB — CANCER ANTIGEN 27.29: CA 27.29: 23.5 U/mL (ref 0.0–38.6)

## 2018-08-04 NOTE — Progress Notes (Signed)
[facility] day:  08/04/18  Chief Complaint: Natalie Monroe is a 68 y.o. female with stage IIA right breast cancer who is seen for 1 year assessment.  PERTINENT ONCOLOGY HISTORY Natalie Monroe is a 68 y.o.afemale who has above oncology history reviewed by me today presented for follow up visit  Patient previously followed up with Dr. Mike Gip.  Establish care with me on 08/04/2018. Extensive medical records review was performed by me.  # 08/2004.Stage IIA right breast cancer s/p lumpectomy and axillary node dissection in   Pathology revealed a 3.2 cm invasive lobular carcinoma.  One of 13 lymph nodes were positive.  Tumor was ER positive and PR positive.  Pathologic stage was T2N1M0. Status post 6 cycles of Taxotere Adriamycin and Cytoxan.  Chemotherapy was completed in December 2006. Adjuvant radiation was completed in March 2007. Patient began anti-hormonal therapy with Arimidex in December 2006.  She was switched to tamoxifen secondary to side effects-arthralgia. Patient finished 5 years of tamoxifen and has been off since December 2011.  Patient has been on annual mammogram.  CA27.29 has been monitored.  Was 32.1 on 12/28/2009, 29.7 on 12/28/2010, 24.7 on 07/07/2011, 24.1 on 08/20/2014, 27.8 on 08/27/2015, and 22.5 on 07/25/2017.   Bone density study on 06/15/2005 revealed osteopenia with a T score of -1.1 in the right hip.  Bone density study on 10/29/2015 revealed osteopenia with a T score of -1.1 in the left femoral neck.  Patient presents for follow-up today. She denies any new complaints.  Has been doing well at her baseline. Denies any concerns of her breast. Denies any new bone pain. She has chronic back pain. She has had annual mammogram done prior to today's visits. Appetite is pretty good.  Patient denies pain in the clinic today.  Past Medical History:  Diagnosis Date  . Breast cancer [facility]) 2006   right breast, chemo  and radiation  . Hypertension   . Personal history of chemotherapy   . Personal history of radiation therapy     Past Surgical History:  Procedure Laterality Date  . ABDOMINAL HYSTERECTOMY    . BREAST BIOPSY Bilateral 2006   left negative right positive  . BREAST LUMPECTOMY Right 2006   positive  . MASTECTOMY, PARTIAL Right   . OOPHORECTOMY      Family History  Problem Relation Age of Onset  . Breast cancer Maternal Aunt 70    Social History:  reports that she has quit smoking. She has never used smokeless tobacco. She reports that she does not use drugs. No history on file for alcohol.  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  She lives in [place].  The patient is alone today.  Allergies: No Known Allergies  Current Medications: Current Outpatient Medications  Medication Sig Dispense Refill  . Aspirin-Acetaminophen-Caffeine (EXCEDRIN PO) Take by mouth.    Marland Kitchen atorvastatin (LIPITOR) 20 MG tablet Take 20 mg by mouth daily.  5  . Calcium Carb-Cholecalciferol (CALCIUM-VITAMIN D) 500-200 MG-UNIT tablet Take 1 tablet by mouth daily.    . Cholecalciferol (VITAMIN D3) 1000 units CAPS Take 1,000 Units by mouth daily.    . Coenzyme Q10 (COQ10) 100 MG CAPS Take 100 mg by mouth daily.    . metoprolol succinate (TOPROL-XL) 100 MG 24 hr tablet Take 100 mg by mouth daily.     . Probiotic Product (PROBIOTIC ADVANCED PO) Take 1 tablet by mouth daily.    . valACYclovir (VALTREX) 1000 MG tablet  Take 2,000 mg by mouth.    . vitamin B-12 (CYANOCOBALAMIN) 1000 MCG tablet Take 500 mcg by mouth daily.    . SUMAtriptan (IMITREX) 100 MG tablet TAKE 1 TABLET BY MOUTH AS DIRECTED AS NEEDED MAY REPEAT IN 2 HOURS IFNEEDED    . triamcinolone cream (KENALOG) 0.1 % APPLY SPARINGLY AND RUB IN WELL TO AFFECTED AREAS DAILY OR USE AS DIRECTED     No current facility-administered medications for this visit.     Review of Systems  Constitutional: Negative for chills, fever, malaise/fatigue and weight loss.   HENT: Negative for sore throat.   Eyes: Negative for redness.  Respiratory: Negative for cough, shortness of breath and wheezing.   Cardiovascular: Negative for chest pain, palpitations and leg swelling.  Gastrointestinal: Negative for abdominal pain, blood in stool, nausea and vomiting.  Genitourinary: Negative for dysuria.  Musculoskeletal: Negative for myalgias.  Skin: Negative for rash.  Neurological: Negative for dizziness, tingling and tremors.  Endo/Heme/Allergies: Does not bruise/bleed easily.  Psychiatric/Behavioral: Negative for hallucinations.   Performance status (ECOG): 0 - Asymptomatic  Vital Signs BP 132/90 (Patient Position: Sitting)   Pulse 69   Temp (!) 95.3 F (35.2 C) (Tympanic)   Wt 133 lb 12.8 oz (60.7 kg)  Physical Exam  Constitutional: She is oriented to person, place, and time. No distress.  HENT:  Head: Normocephalic and atraumatic.  Nose: Nose normal.  Mouth/Throat: Oropharynx is clear and moist. No oropharyngeal exudate.  Eyes: Pupils are equal, round, and reactive to light. EOM are normal. No scleral icterus.  Neck: Normal range of motion. Neck supple.  Cardiovascular: Normal rate and regular rhythm.  No murmur heard. Pulmonary/Chest: Effort normal. No respiratory distress. She has no rales. She exhibits no tenderness.  Abdominal: Soft. She exhibits no distension. There is no abdominal tenderness.  Musculoskeletal: Normal range of motion.        General: No edema.  Neurological: She is alert and oriented to person, place, and time. No cranial nerve deficit. She exhibits normal muscle tone. Coordination normal.  Skin: Skin is warm and dry. She is not diaphoretic. No erythema.  Psychiatric: Affect normal.   Breast exam was performed in seated and lying down position. Patient is status post right lumpectomy.  Right breast smaller than left breast. She has mild fibrotic changes in her right breast.  No palpable masses.  No palpable masses in left  breast. No palpable axillary adenopathy bilaterally.     Appointment on 08/03/2018  Component Date Value Ref Range Status  . CA 27.29 08/03/2018 23.5  0.0 - 38.6 U/mL Final   Comment: (NOTE) Siemens Centaur Immunochemiluminometric Methodology [facility]) Values obtained with different assay methods or kits cannot be used interchangeably. Results cannot be interpreted as absolute evidence of the presence or absence of malignant disease. Performed At: [facility] [place], [place] [id number] Rush Farmer MD WH:[id number]   . Sodium 08/03/2018 144  135 - 145 mmol/L Final  . Potassium 08/03/2018 4.3  3.5 - 5.1 mmol/L Final  . Chloride 08/03/2018 109  98 - 111 mmol/L Final  . CO2 08/03/2018 26  22 - 32 mmol/L Final  . Glucose, Bld 08/03/2018 105* 70 - 99 mg/dL Final  . BUN 08/03/2018 15  8 - 23 mg/dL Final  . Creatinine, Ser 08/03/2018 0.75  0.44 - 1.00 mg/dL Final  . Calcium 08/03/2018 9.6  8.9 - 10.3 mg/dL Final  . Total Protein 08/03/2018 7.1  6.5 - 8.1 g/dL Final  . Albumin  08/03/2018 4.4  3.5 - 5.0 g/dL Final  . AST 08/03/2018 18  15 - 41 U/L Final  . ALT 08/03/2018 17  0 - 44 U/L Final  . Alkaline Phosphatase 08/03/2018 76  38 - 126 U/L Final  . Total Bilirubin 08/03/2018 0.8  0.3 - 1.2 mg/dL Final  . GFR calc non Af Amer 08/03/2018 >60  >60 mL/min Final  . GFR calc Af Amer 08/03/2018 >60  >60 mL/min Final  . Anion gap 08/03/2018 9  5 - 15 Final   Performed at [facility], [address], [place], [place] [address]  . WBC 08/03/2018 6.5  4.0 - 10.5 K/uL Final  . RBC 08/03/2018 4.49  3.87 - 5.11 MIL/uL Final  . Hemoglobin 08/03/2018 13.4  12.0 - 15.0 g/dL Final  . HCT 08/03/2018 40.0  36.0 - 46.0 % Final  . MCV 08/03/2018 89.1  80.0 - 100.0 fL Final  . MCH 08/03/2018 29.8  26.0 - 34.0 pg Final  . MCHC 08/03/2018 33.5  30.0 - 36.0 g/dL Final  . RDW 08/03/2018 12.7  11.5 - 15.5 % Final  . Platelets 08/03/2018 230  150 - 400 K/uL Final  . nRBC  08/03/2018 0.0  0.0 - 0.2 % Final  . Neutrophils Relative % 08/03/2018 56  % Final  . Neutro Abs 08/03/2018 3.6  1.7 - 7.7 K/uL Final  . Lymphocytes Relative 08/03/2018 33  % Final  . Lymphs Abs 08/03/2018 2.2  0.7 - 4.0 K/uL Final  . Monocytes Relative 08/03/2018 7  % Final  . Monocytes Absolute 08/03/2018 0.5  0.1 - 1.0 K/uL Final  . Eosinophils Relative 08/03/2018 3  % Final  . Eosinophils Absolute 08/03/2018 0.2  0.0 - 0.5 K/uL Final  . Basophils Relative 08/03/2018 1  % Final  . Basophils Absolute 08/03/2018 0.0  0.0 - 0.1 K/uL Final  . Immature Granulocytes 08/03/2018 0  % Final  . Abs Immature Granulocytes 08/03/2018 0.01  0.00 - 0.07 K/uL Final   Performed at [facility], [address] [place], [place] [address]    [place] on 08/14/2015 revealed a hematocrit of 42.4, hemoglobin 14.0, MCV 90, platelets 254,000, white count 7100 with an [place] of 4600. Comprehensive metabolic panel included a sodium of 142, potassium 4.4, BUN 17, creatinine 0.61, calcium 9.8, albumin 4.6, bilirubin 0.6, SGOT 11, SGPT 11, and alkaline phosphatase 102. Labs on 08/12/2016 revealed a hematocrit of 39.8, hemoglobin 13.3, MCV 87, platelets 265,000, white count 7200 with an ANC of 4200. Comprehensive metabolic panel included a sodium of 144, potassium 4.4, BUN 18, creatinine 0.58, calcium 9.5, albumin 4.5, bilirubin 0.6, SGOT 13, SGPT 13, and alkaline phosphatase 78. Labs on 07/25/2017 revealed a hematocrit of 41.4, hemoglobin 13.9, MCV 88, platelets 268,000, white count 6900 with an ANC of 4200. Comprehensive metabolic panel included a sodium of 145, potassium 4.1, BUN 15, creatinine 0.92, calcium 9.7, albumin 4.6, bilirubin 0.6, SGOT 15, SGPT 14, and alkaline phosphatase 83. CA27.29 was 22.5.  Laboratory results, I have independently reviewed below lab results. CBC    Component Value Date/Time   WBC 6.5 08/03/2018 1342   RBC 4.49 08/03/2018 1342   HGB 13.4 08/03/2018 1342   HGB 14.0  08/20/2014 0952   HCT 40.0 08/03/2018 1342   HCT 40.9 08/20/2014 0952   PLT 230 08/03/2018 1342   PLT 268 08/20/2014 0952   MCV 89.1 08/03/2018 1342   MCV 87 08/20/2014 0952   MCV 87 01/27/2013 0104  MCH 29.8 08/03/2018 1342   MCHC 33.5 08/03/2018 1342   RDW 12.7 08/03/2018 1342   RDW 13.6 08/20/2014 0952   RDW 13.8 01/27/2013 0104   LYMPHSABS 2.2 08/03/2018 1342   LYMPHSABS 2.3 08/20/2014 0952   LYMPHSABS 2.3 01/27/2013 0104   MONOABS 0.5 08/03/2018 1342   MONOABS 0.8 01/27/2013 0104   EOSABS 0.2 08/03/2018 1342   EOSABS 0.3 08/20/2014 0952   EOSABS 0.1 01/27/2013 0104   BASOSABS 0.0 08/03/2018 1342   BASOSABS 0.0 08/20/2014 0952   BASOSABS 0.1 01/27/2013 0104   CMP Latest Ref Rng & Units 08/03/2018 08/20/2014 01/27/2013  Glucose 70 - 99 mg/dL 105(H) 99 97  BUN 8 - 23 mg/dL 15 17 14   Creatinine 0.44 - 1.00 mg/dL 0.75 0.71 0.71  Sodium 135 - 145 mmol/L 144 145(H) 139  Potassium 3.5 - 5.1 mmol/L 4.3 4.5 3.3(L)  Chloride 98 - 111 mmol/L 109 105 108(H)  CO2 22 - 32 mmol/L 26 17(L) 24  Calcium 8.9 - 10.3 mg/dL 9.6 9.3 8.6  Total Protein 6.5 - 8.1 g/dL 7.1 6.2 -  Total Bilirubin 0.3 - 1.2 mg/dL 0.8 0.2 -  Alkaline Phos 38 - 126 U/L 76 94 -  AST 15 - 41 U/L 18 11 -  ALT 0 - 44 U/L 17 12 -    Assessment:  Natalie Monroe is a 68 y.o. female with history of stage IIa breast cancer presents for follow-up. 1. Malignant neoplasm of right breast in female, estrogen receptor positive, unspecified site of breast ([place])   2. Osteopenia, unspecified location    07/31/2018 Bilateral screening mammogram images were independently reviewed by me and discussed with patient.  There are no findings suspicious for malignancy. Labs reviewed and discussed with patient.  All remarkable. We discussed about stopping checking CA-27-29.  She agrees with the plan. Patient will need annual bilateral screening mammogram. Osteopenia, recommend continue vitamin D and calcium supplementation.  Return in  1 year for lab and MD assessment, and review of mammogram. We spent sufficient time to discuss many aspect of care, questions were answered to patient's satisfaction. Total face to face encounter time for this patient visit was 25 min. >50% of the time was  spent in counseling and coordination of care.    Earlie Server, MD  08/04/2018, 9:47 PM

## 2018-08-16 ENCOUNTER — Other Ambulatory Visit: Payer: PRIVATE HEALTH INSURANCE

## 2018-08-16 ENCOUNTER — Ambulatory Visit: Payer: PRIVATE HEALTH INSURANCE | Admitting: Hematology and Oncology

## 2019-05-08 ENCOUNTER — Inpatient Hospital Stay: Payer: Medicare Other | Attending: Oncology | Admitting: Oncology

## 2019-05-08 ENCOUNTER — Other Ambulatory Visit: Payer: Self-pay

## 2019-05-08 ENCOUNTER — Inpatient Hospital Stay: Payer: Medicare Other

## 2019-05-08 ENCOUNTER — Encounter: Payer: Self-pay | Admitting: Oncology

## 2019-05-08 VITALS — BP 139/78 | HR 79 | Temp 96.9°F | Resp 16 | Wt 133.5 lb

## 2019-05-08 DIAGNOSIS — R1909 Other intra-abdominal and pelvic swelling, mass and lump: Secondary | ICD-10-CM

## 2019-05-08 DIAGNOSIS — C50911 Malignant neoplasm of unspecified site of right female breast: Secondary | ICD-10-CM

## 2019-05-08 DIAGNOSIS — Z170 Estrogen receptor positive status [ER+]: Secondary | ICD-10-CM

## 2019-05-08 DIAGNOSIS — M8588 Other specified disorders of bone density and structure, other site: Secondary | ICD-10-CM | POA: Insufficient documentation

## 2019-05-08 DIAGNOSIS — M7989 Other specified soft tissue disorders: Secondary | ICD-10-CM

## 2019-05-08 DIAGNOSIS — R2231 Localized swelling, mass and lump, right upper limb: Secondary | ICD-10-CM | POA: Diagnosis present

## 2019-05-08 DIAGNOSIS — Z87891 Personal history of nicotine dependence: Secondary | ICD-10-CM | POA: Insufficient documentation

## 2019-05-08 DIAGNOSIS — Z9221 Personal history of antineoplastic chemotherapy: Secondary | ICD-10-CM | POA: Insufficient documentation

## 2019-05-08 DIAGNOSIS — Z923 Personal history of irradiation: Secondary | ICD-10-CM | POA: Diagnosis not present

## 2019-05-08 DIAGNOSIS — Z853 Personal history of malignant neoplasm of breast: Secondary | ICD-10-CM | POA: Diagnosis present

## 2019-05-08 LAB — CBC WITH DIFFERENTIAL/PLATELET
Abs Immature Granulocytes: 0.03 10*3/uL (ref 0.00–0.07)
Basophils Absolute: 0 10*3/uL (ref 0.0–0.1)
Basophils Relative: 0 %
Eosinophils Absolute: 0.2 10*3/uL (ref 0.0–0.5)
Eosinophils Relative: 2 %
HCT: 42.7 % (ref 36.0–46.0)
Hemoglobin: 14.4 g/dL (ref 12.0–15.0)
Immature Granulocytes: 0 %
Lymphocytes Relative: 26 %
Lymphs Abs: 2.1 10*3/uL (ref 0.7–4.0)
MCH: 31.2 pg (ref 26.0–34.0)
MCHC: 33.7 g/dL (ref 30.0–36.0)
MCV: 92.6 fL (ref 80.0–100.0)
Monocytes Absolute: 0.6 10*3/uL (ref 0.1–1.0)
Monocytes Relative: 7 %
Neutro Abs: 5.2 10*3/uL (ref 1.7–7.7)
Neutrophils Relative %: 65 %
Platelets: 254 10*3/uL (ref 150–400)
RBC: 4.61 MIL/uL (ref 3.87–5.11)
RDW: 12.6 % (ref 11.5–15.5)
WBC: 8.1 10*3/uL (ref 4.0–10.5)
nRBC: 0 % (ref 0.0–0.2)

## 2019-05-08 LAB — COMPREHENSIVE METABOLIC PANEL
ALT: 19 U/L (ref 0–44)
AST: 19 U/L (ref 15–41)
Albumin: 4.7 g/dL (ref 3.5–5.0)
Alkaline Phosphatase: 65 U/L (ref 38–126)
Anion gap: 9 (ref 5–15)
BUN: 14 mg/dL (ref 8–23)
CO2: 29 mmol/L (ref 22–32)
Calcium: 9.9 mg/dL (ref 8.9–10.3)
Chloride: 105 mmol/L (ref 98–111)
Creatinine, Ser: 0.69 mg/dL (ref 0.44–1.00)
GFR calc Af Amer: >60 mL/min (ref >60)
GFR calc non Af Amer: >60 mL/min (ref >60)
Glucose, Bld: 101 mg/dL — ABNORMAL HIGH (ref 70–99)
Potassium: 4.1 mmol/L (ref 3.5–5.1)
Sodium: 143 mmol/L (ref 135–145)
Total Bilirubin: 1 mg/dL (ref 0.3–1.2)
Total Protein: 7.3 g/dL (ref 6.5–8.1)

## 2019-05-08 NOTE — Progress Notes (Signed)
Patient noticed last week a swollen area in right axillary.  Also has swollen area in left groin for a while.

## 2019-05-09 LAB — CANCER ANTIGEN 27.29: CA 27.29: 22.6 U/mL (ref 0.0–38.6)

## 2019-05-10 NOTE — Progress Notes (Signed)
[facility] day:  05/10/19  Chief Complaint: Natalie Monroe is a 69 y.o. female with stage IIA right breast cancer who is seen for 1 year assessment.  PERTINENT ONCOLOGY HISTORY Natalie Monroe is a 69 y.o.afemale who has above oncology history reviewed by me today presented for follow up visit  Patient previously followed up with Dr. Mike Gip.  Establish care with me on 08/04/2018. Extensive medical records review was performed by me.  # 08/2004.Stage IIA right breast cancer s/p lumpectomy and axillary node dissection in   Pathology revealed a 3.2 cm invasive lobular carcinoma.  One of 13 lymph nodes were positive.  Tumor was ER positive and PR positive.  Pathologic stage was T2N1M0. Status post 6 cycles of Taxotere Adriamycin and Cytoxan.  Chemotherapy was completed in December 2006. Adjuvant radiation was completed in March 2007. Patient began anti-hormonal therapy with Arimidex in December 2006.  She was switched to tamoxifen secondary to side effects-arthralgia. Patient finished 5 years of tamoxifen and has been off since December 2011.  Patient has been on annual mammogram.  CA27.29 has been monitored.  Was 32.1 on 12/28/2009, 29.7 on 12/28/2010, 24.7 on 07/07/2011, 24.1 on 08/20/2014, 27.8 on 08/27/2015, and 22.5 on 07/25/2017.   Bone density study on 06/15/2005 revealed osteopenia with a T score of -1.1 in the right hip.  Bone density study on 10/29/2015 revealed osteopenia with a T score of -1.1 in the left femoral neck.   INTERVAL HISTORY Natalie Monroe is a 69 y.o. female who has above history reviewed by me today presents for concerns of her right axillary. Problems and complaints are listed below: Patient reports right axillary swelling which is new to her.  Denies any recent injury or trauma. She has been doing some yard work recently with repetitive right upper arm use. Denies any new bone pain.  She has chronic back  pain. Patient has annual screening mammogram with last one done in July 2020  She also had felt left inguinal knot which she does not know how long it has been there.  She felt it when taking a shower.  Denies any pain or discomfort.  Past Medical History:  Diagnosis Date  . Breast cancer [facility]) 2006   right breast, chemo and radiation  . Hypertension   . Personal history of chemotherapy   . Personal history of radiation therapy     Past Surgical History:  Procedure Laterality Date  . ABDOMINAL HYSTERECTOMY    . BREAST BIOPSY Bilateral 2006   left negative right positive  . BREAST LUMPECTOMY Right 2006   positive  . MASTECTOMY, PARTIAL Right   . OOPHORECTOMY      Family History  Problem Relation Age of Onset  . Breast cancer Maternal Aunt 70    Social History:  reports that she has quit smoking. She has never used smokeless tobacco. She reports that she does not use drugs. No history on file for alcohol.  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  She lives in [place].  The patient is alone today.  Allergies: No Known Allergies  Current Medications: Current Outpatient Medications  Medication Sig Dispense Refill  . acyclovir (ZOVIRAX) 400 MG tablet     . Aspirin-Acetaminophen-Caffeine (EXCEDRIN PO) Take by mouth.    Marland Kitchen atorvastatin (LIPITOR) 20 MG tablet Take 20 mg by mouth daily.  5  . Calcium Carb-Cholecalciferol (CALCIUM-VITAMIN D) 500-200 MG-UNIT tablet Take 1 tablet by mouth daily.    Marland Kitchen  cetirizine (ZYRTEC ALLERGY) 10 MG tablet     . Cholecalciferol (VITAMIN D3) 1000 units CAPS Take 1,000 Units by mouth daily.    . Coenzyme Q10 (COQ10) 100 MG CAPS Take 100 mg by mouth daily.    . fexofenadine (ALLEGRA) 180 MG tablet Take 180 mg by mouth daily.    . metoprolol succinate (TOPROL-XL) 100 MG 24 hr tablet Take 100 mg by mouth daily.     . Probiotic Product (PROBIOTIC ADVANCED PO) Take 1 tablet by mouth daily.    . SUMAtriptan (IMITREX) 100 MG tablet TAKE 1 TABLET BY MOUTH  AS DIRECTED AS NEEDED MAY REPEAT IN 2 HOURS IFNEEDED    . triamcinolone cream (KENALOG) 0.1 % APPLY SPARINGLY AND RUB IN WELL TO AFFECTED AREAS DAILY OR USE AS DIRECTED    . vitamin B-12 (CYANOCOBALAMIN) 1000 MCG tablet Take 500 mcg by mouth daily.    . valACYclovir (VALTREX) 1000 MG tablet Take 2,000 mg by mouth.     No current facility-administered medications for this visit.    Review of Systems  Constitutional: Negative for chills, fever, malaise/fatigue and weight loss.  HENT: Negative for sore throat.   Eyes: Negative for redness.  Respiratory: Negative for cough, shortness of breath and wheezing.   Cardiovascular: Negative for chest pain, palpitations and leg swelling.  Gastrointestinal: Negative for abdominal pain, blood in stool, nausea and vomiting.  Genitourinary: Negative for dysuria.  Musculoskeletal: Negative for myalgias.  Skin: Negative for rash.  Neurological: Negative for dizziness, tingling and tremors.  Endo/Heme/Allergies: Does not bruise/bleed easily.  Psychiatric/Behavioral: Negative for hallucinations.   Performance status (ECOG): 0 - Asymptomatic  Vital Signs BP 139/78   Pulse 79   Temp (!) 96.9 F (36.1 C)   Resp 16   Wt 133 lb 8 oz (60.6 kg)  Physical Exam  Constitutional: She is oriented to person, place, and time. No distress.  HENT:  Head: Normocephalic and atraumatic.  Nose: Nose normal.  Mouth/Throat: Oropharynx is clear and moist. No oropharyngeal exudate.  Eyes: Pupils are equal, round, and reactive to light. EOM are normal. No scleral icterus.  Cardiovascular: Normal rate and regular rhythm.  No murmur heard. Pulmonary/Chest: Effort normal. No respiratory distress. She has no rales. She exhibits no tenderness.  Abdominal: Soft. She exhibits no distension. There is no abdominal tenderness.  Musculoskeletal:        General: No edema. Normal range of motion.     Cervical back: Normal range of motion and neck supple.  Neurological: She is  alert and oriented to person, place, and time. No cranial nerve deficit. She exhibits normal muscle tone. Coordination normal.  Skin: Skin is warm and dry. She is not diaphoretic. No erythema.  Psychiatric: Affect normal.   Breast exam was performed in seated and lying down position. Patient is status post right lumpectomy.  Right breast smaller than left breast. She has mild fibrotic changes in her right breast.  No palpable masses.  No palpable masses in left breast. Right axillary soft tissue swelling.     Appointment on 05/08/2019  Component Date Value Ref Range Status  . CA 27.29 05/08/2019 22.6  0.0 - 38.6 U/mL Final   Comment: (NOTE) Siemens Centaur Immunochemiluminometric Methodology [facility]) Values obtained with different assay methods or kits cannot be used interchangeably. Results cannot be interpreted as absolute evidence of the presence or absence of malignant disease. Performed At: [facility] [place], [place] [id number] Rush Farmer MD [id number]   . Sodium  05/08/2019 143  135 - 145 mmol/L Final  . Potassium 05/08/2019 4.1  3.5 - 5.1 mmol/L Final  . Chloride 05/08/2019 105  98 - 111 mmol/L Final  . CO2 05/08/2019 29  22 - 32 mmol/L Final  . Glucose, Bld 05/08/2019 101* 70 - 99 mg/dL Final   Glucose reference range applies only to samples taken after fasting for at least 8 hours.  . BUN 05/08/2019 14  8 - 23 mg/dL Final  . Creatinine, Ser 05/08/2019 0.69  0.44 - 1.00 mg/dL Final  . Calcium 05/08/2019 9.9  8.9 - 10.3 mg/dL Final  . Total Protein 05/08/2019 7.3  6.5 - 8.1 g/dL Final  . Albumin 05/08/2019 4.7  3.5 - 5.0 g/dL Final  . AST 05/08/2019 19  15 - 41 U/L Final  . ALT 05/08/2019 19  0 - 44 U/L Final  . Alkaline Phosphatase 05/08/2019 65  38 - 126 U/L Final  . Total Bilirubin 05/08/2019 1.0  0.3 - 1.2 mg/dL Final  . GFR calc non Af Amer 05/08/2019 >60  >60 mL/min Final  . GFR calc Af Amer 05/08/2019 >60  >60 mL/min Final  . Anion  gap 05/08/2019 9  5 - 15 Final   Performed at [facility], [address], [place], [place] [address]  . WBC 05/08/2019 8.1  4.0 - 10.5 K/uL Final  . RBC 05/08/2019 4.61  3.87 - 5.11 MIL/uL Final  . Hemoglobin 05/08/2019 14.4  12.0 - 15.0 g/dL Final  . HCT 05/08/2019 42.7  36.0 - 46.0 % Final  . MCV 05/08/2019 92.6  80.0 - 100.0 fL Final  . MCH 05/08/2019 31.2  26.0 - 34.0 pg Final  . MCHC 05/08/2019 33.7  30.0 - 36.0 g/dL Final  . RDW 05/08/2019 12.6  11.5 - 15.5 % Final  . Platelets 05/08/2019 254  150 - 400 K/uL Final  . nRBC 05/08/2019 0.0  0.0 - 0.2 % Final  . Neutrophils Relative % 05/08/2019 65  % Final  . Neutro Abs 05/08/2019 5.2  1.7 - 7.7 K/uL Final  . Lymphocytes Relative 05/08/2019 26  % Final  . Lymphs Abs 05/08/2019 2.1  0.7 - 4.0 K/uL Final  . Monocytes Relative 05/08/2019 7  % Final  . Monocytes Absolute 05/08/2019 0.6  0.1 - 1.0 K/uL Final  . Eosinophils Relative 05/08/2019 2  % Final  . Eosinophils Absolute 05/08/2019 0.2  0.0 - 0.5 K/uL Final  . Basophils Relative 05/08/2019 0  % Final  . Basophils Absolute 05/08/2019 0.0  0.0 - 0.1 K/uL Final  . Immature Granulocytes 05/08/2019 0  % Final  . Abs Immature Granulocytes 05/08/2019 0.03  0.00 - 0.07 K/uL Final   Performed at [facility], [address] [place], [place] [address]    [place] on 08/14/2015 revealed a hematocrit of 42.4, hemoglobin 14.0, MCV 90, platelets 254,000, white count 7100 with Natalie Millwood of 4600. Comprehensive metabolic panel included a sodium of 142, potassium 4.4, BUN 17, creatinine 0.61, calcium 9.8, albumin 4.6, bilirubin 0.6, SGOT 11, SGPT 11, and alkaline phosphatase 102. Labs on 08/12/2016 revealed a hematocrit of 39.8, hemoglobin 13.3, MCV 87, platelets 265,000, white count 7200 with Natalie ANC of 4200. Comprehensive metabolic panel included a sodium of 144, potassium 4.4, BUN 18, creatinine 0.58, calcium 9.5, albumin 4.5, bilirubin 0.6, SGOT 13, SGPT 13, and alkaline  phosphatase 78. Labs on 07/25/2017 revealed a hematocrit of 41.4, hemoglobin 13.9, MCV 88, platelets 268,000, white count 6900 with Natalie  Oriental of 4200. Comprehensive metabolic panel included a sodium of 145, potassium 4.1, BUN 15, creatinine 0.92, calcium 9.7, albumin 4.6, bilirubin 0.6, SGOT 15, SGPT 14, and alkaline phosphatase 83. CA27.29 was 22.5.  Laboratory results, I have independently reviewed below lab results. CBC    Component Value Date/Time   WBC 8.1 05/08/2019 1535   RBC 4.61 05/08/2019 1535   HGB 14.4 05/08/2019 1535   HGB 14.0 08/20/2014 0952   HCT 42.7 05/08/2019 1535   HCT 40.9 08/20/2014 0952   PLT 254 05/08/2019 1535   PLT 268 08/20/2014 0952   MCV 92.6 05/08/2019 1535   MCV 87 08/20/2014 0952   MCV 87 01/27/2013 0104   MCH 31.2 05/08/2019 1535   MCHC 33.7 05/08/2019 1535   RDW 12.6 05/08/2019 1535   RDW 13.6 08/20/2014 0952   RDW 13.8 01/27/2013 0104   LYMPHSABS 2.1 05/08/2019 1535   LYMPHSABS 2.3 08/20/2014 0952   LYMPHSABS 2.3 01/27/2013 0104   MONOABS 0.6 05/08/2019 1535   MONOABS 0.8 01/27/2013 0104   EOSABS 0.2 05/08/2019 1535   EOSABS 0.3 08/20/2014 0952   EOSABS 0.1 01/27/2013 0104   BASOSABS 0.0 05/08/2019 1535   BASOSABS 0.0 08/20/2014 0952   BASOSABS 0.1 01/27/2013 0104   CMP Latest Ref Rng & Units 05/08/2019 08/03/2018 08/20/2014  Glucose 70 - 99 mg/dL 101(H) 105(H) 99  BUN 8 - 23 mg/dL 14 15 17   Creatinine 0.44 - 1.00 mg/dL 0.69 0.75 0.71  Sodium 135 - 145 mmol/L 143 144 145(H)  Potassium 3.5 - 5.1 mmol/L 4.1 4.3 4.5  Chloride 98 - 111 mmol/L 105 109 105  CO2 22 - 32 mmol/L 29 26 17(L)  Calcium 8.9 - 10.3 mg/dL 9.9 9.6 9.3  Total Protein 6.5 - 8.1 g/dL 7.3 7.1 6.2  Total Bilirubin 0.3 - 1.2 mg/dL 1.0 0.8 0.2  Alkaline Phos 38 - 126 U/L 65 76 94  AST 15 - 41 U/L 19 18 11   ALT 0 - 44 U/L 19 17 12     Assessment:  Natalie Monroe is a 69 y.o. female with history of stage IIa breast cancer presents for follow-up. 1. History of right breast  cancer   2. Right axillary swelling   3. Left groin mass    Right axillary swelling Patient has previously underwent axillary node dissection.  Discussed with patient that the swelling might be secondary to lymphedema versus nodal recurrence Obtain unilateral right diagnostic mammogram with ultrasound of axillary for further evaluation Check CBC, CMP, CA 27-29. Patient will keep her appointment in July.  Left groin concern, I will refer patient to surgery for further evaluation.  We spent sufficient time to discuss many aspect of care, questions were answered to patient's satisfaction.   Earlie Server, MD  05/10/2019, 8:11 AM

## 2019-05-16 ENCOUNTER — Other Ambulatory Visit: Payer: Self-pay | Admitting: General Surgery

## 2019-05-16 ENCOUNTER — Other Ambulatory Visit (HOSPITAL_COMMUNITY): Payer: Self-pay | Admitting: General Surgery

## 2019-05-16 ENCOUNTER — Other Ambulatory Visit: Payer: Self-pay | Admitting: Oncology

## 2019-05-16 DIAGNOSIS — R1909 Other intra-abdominal and pelvic swelling, mass and lump: Secondary | ICD-10-CM

## 2019-05-17 ENCOUNTER — Ambulatory Visit
Admission: RE | Admit: 2019-05-17 | Discharge: 2019-05-17 | Disposition: A | Discharge status: Home / Self Care | Payer: Medicare Other | Source: Ambulatory Visit | Attending: Oncology | Admitting: Oncology

## 2019-05-17 DIAGNOSIS — Z853 Personal history of malignant neoplasm of breast: Secondary | ICD-10-CM

## 2019-05-17 DIAGNOSIS — M7989 Other specified soft tissue disorders: Secondary | ICD-10-CM | POA: Diagnosis present

## 2019-05-21 ENCOUNTER — Telehealth: Payer: Self-pay

## 2019-05-21 ENCOUNTER — Other Ambulatory Visit: Payer: Self-pay

## 2019-05-21 DIAGNOSIS — M7989 Other specified soft tissue disorders: Secondary | ICD-10-CM

## 2019-05-21 NOTE — Telephone Encounter (Signed)
-----   Message from Earlie Server, MD sent at 05/20/2019 10:23 PM EDT ----- Please refer her to lymph edema clinic. Thanks. Follow up as planned

## 2019-05-21 NOTE — Telephone Encounter (Signed)
Patient notified and referral entered. 

## 2019-05-22 ENCOUNTER — Other Ambulatory Visit: Payer: Self-pay

## 2019-05-22 ENCOUNTER — Ambulatory Visit
Admission: RE | Admit: 2019-05-22 | Discharge: 2019-05-22 | Disposition: A | Discharge status: Home / Self Care | Payer: Medicare Other | Source: Ambulatory Visit | Attending: General Surgery | Admitting: General Surgery

## 2019-05-22 DIAGNOSIS — R1909 Other intra-abdominal and pelvic swelling, mass and lump: Secondary | ICD-10-CM | POA: Diagnosis present

## 2019-06-03 ENCOUNTER — Ambulatory Visit: Payer: Medicare Other | Admitting: Occupational Therapy

## 2019-06-11 ENCOUNTER — Encounter: Payer: Self-pay | Admitting: Occupational Therapy

## 2019-06-11 ENCOUNTER — Ambulatory Visit: Payer: Medicare Other | Attending: Oncology | Admitting: Occupational Therapy

## 2019-06-11 ENCOUNTER — Other Ambulatory Visit: Payer: Self-pay

## 2019-06-11 DIAGNOSIS — I890 Lymphedema, not elsewhere classified: Secondary | ICD-10-CM | POA: Insufficient documentation

## 2019-06-11 DIAGNOSIS — R600 Localized edema: Secondary | ICD-10-CM | POA: Diagnosis present

## 2019-06-13 NOTE — Therapy (Signed)
[facility] [address], [place], [address] Phone: [phone]   Fax:  [phone]  Occupational Therapy Evaluation  Patient Details  Name: Natalie Monroe MRN: [id number] Date of Birth: 05/03/1950 Referring Provider (OT): Laqueta Jean Date: 06/11/2019  OT End of Session - 06/13/19 2114    Visit Number  1    Number of Visits  3    Date for OT Re-Evaluation  09/05/19    OT Start Time  1300    OT Stop Time  1414    OT Time Calculation (min)  74 min    Activity Tolerance  Patient tolerated treatment well    Behavior During Therapy  [facility] for tasks assessed/performed       Past Medical History:  Diagnosis Date  . Breast cancer [facility]) 2006   right breast, chemo and radiation  . Hypertension   . Personal history of chemotherapy   . Personal history of radiation therapy     Past Surgical History:  Procedure Laterality Date  . ABDOMINAL HYSTERECTOMY    . BREAST BIOPSY Bilateral 2006   left negative right positive  . BREAST LUMPECTOMY Right 2006   positive  . MASTECTOMY, PARTIAL Right   . OOPHORECTOMY      There were no vitals filed for this visit.  Subjective Assessment - 06/13/19 2102    Subjective   Patient reports recent swelling in her right axillary area after her COVID 19 vaccination.  She reports when she noticed the edema she had performed a lot of gardening that week, was using a short handled hoe and by the end of the week she noticed edema in the armpit.    Pertinent History  Patient with a history of right breast cancer, s/p lumpectomy then partial mastectomy with sentinal node biopsy.  She reports chemo and radiation in 2006 and denies any issues until recently.  She reports she had her COVID 19 vaccination in March and then on 04/29/2019 she had been working in the garden all week and noticed some edema in her right axillary area.  She was seen by Dr. Ferrel Logan who found a lipoma in her left  groin area which was removed last Thursday.  Her recent mammogram was normal .  History of CTS for bilateral UEs.    Patient Stated Goals  Pt. reports she wants to be able to manage any symptoms of lymphedema.    Multiple Pain Sites  No          LYMPHEDEMA/ONCOLOGY QUESTIONNAIRE - 06/13/19 2109      Right Upper Extremity Lymphedema   At Axilla   30.8 cm    15 cm Proximal to Olecranon Process  29.5 cm    10 cm Proximal to Olecranon Process  26 cm    Olecranon Process  23.5 cm    15 cm Proximal to Ulnar Styloid Process  22.1 cm    10 cm Proximal to Ulnar Styloid Process  19.7 cm    Just Proximal to Ulnar Styloid Process  14.8 cm    Across Hand at [employer]  17.4 cm    At [place] of 2nd Digit  6.3 cm    At [facility] of Thumb  6.3 cm      Left Upper Extremity Lymphedema   At Axilla   30.2 cm    15 cm Proximal to Olecranon Process  28.5 cm    10 cm Proximal to Olecranon  Process  25.8 cm    Olecranon Process  23.2 cm    15 cm Proximal to Ulnar Styloid Process  22.3 cm    10 cm Proximal to Ulnar Styloid Process  19.9 cm    Just Proximal to Ulnar Styloid Process  14.6 cm    Across Hand at [employer]  17.6 cm    At [place] of 2nd Digit  6.1 cm    At [facility] of Thumb  6 cm        Patient seen for application and instruction of manual lymphatic drainage techniques to right UE and associated pathways as per clinical protocol.   Written program sent to patient via email to outline protocol as directed during session.    Issued written exercise program to include deep breathing techniques as well as ROM exs.  Discussed recommendations for preventative compression sleeve to wear while performing heavier work tasks such as gardening and any potential travel via air.   Discussed additional signs of lymphedema such as tightness of clothing, heaviness, tightness in arm.  Circumferential measurements on file for baseline.  Patient to follow up as needed if signs or symptoms increase over the  next few months.              OT Education - 06/13/19 2113    Education Details  findings of eval, manual lymphatic drainage techniques, recommendations for preventative compression sleeve.    Person(s) Educated  Patient    Methods  Explanation;Demonstration    Comprehension  Returned demonstration;Verbalized understanding;Verbal cues required          OT Long Term Goals - 06/13/19 2117      OT LONG TERM GOAL #1   Title  Patient will demonstrate manual lymphatic drainage techniques with modified independence.    Baseline  no knowledge at eval    Time  12    Period  Weeks    Status  New    Target Date  09/05/19      OT LONG TERM GOAL #2   Title  Patient will demonstrate knowledge of use of preventative compression sleeve when performing heavier tasks such as gardening.    Baseline  no compression at evaluation, may use as preventative measure    Time  12    Period  Weeks    Status  New    Target Date  09/05/19            Plan - 06/13/19 2116    Clinical Impression Statement  Patient seen for evaluation for right UE lymphedema this date.  Patient's circumferential measurements taken and recorded on flowsheet with measurements comparable between right and left UEs with no increased edema noted in right arm.  Patient did appear to have some mild edema in the axillary area on the right just above her scar.  Mild edema noted at pectoralis area on the right. Patient educated on manual lymphatic drainage techniques to assist with lymphatic flow, directing to other healthy areas.  Discussed potential use of preventative sleeve when performing heavy work tasks such as yard work or if planning to fly for travel.  Will have baseline measurements on file and continue to monitor as needed to determine any further needs.    OT Occupational Profile and History  Detailed Assessment- Review of Records and additional review of physical, cognitive, psychosocial history related to  current functional performance    Occupational performance deficits (Please refer to evaluation for details):  IADL's;Leisure  Body Structure / Function / Physical Skills  IADL;Strength;Edema    Psychosocial Skills  Environmental  Adaptations;Routines and Behaviors    Rehab Potential  Excellent    Clinical Decision Making  Limited treatment options, no task modification necessary    Comorbidities Affecting Occupational Performance:  May have comorbidities impacting occupational performance    Modification or Assistance to Complete Evaluation   No modification of tasks or assist necessary to complete eval    OT Frequency  Monthly    OT Duration  12 weeks    OT Treatment/Interventions  Therapeutic exercise;Manual Therapy;Manual lymph drainage;Patient/family education    Consulted and Agree with Plan of Care  Patient       Patient will benefit from skilled therapeutic intervention in order to improve the following deficits and impairments:   Body Structure / Function / Physical Skills: IADL, Strength, Edema   Psychosocial Skills: Environmental  Adaptations, Routines and Behaviors   Visit Diagnosis: Lymphedema, not elsewhere classified  Localized edema    Problem List Patient Active Problem List   Diagnosis Date Noted  . Vertigo 05/29/2018  . Essential hypertension 04/13/2015  . Breast CA ([place]) 08/06/2014  . Abnormal mental state 05/22/2013  . Amnesia, global, transient 05/22/2013  . Carpal tunnel syndrome 03/28/2013  . Cephalalgia 03/28/2013  . Hypercholesterolemia without hypertriglyceridemia 03/28/2013   Natalie Monroe, OTR/L, CLT  Natalie Monroe 06/13/2019, 9:26 PM  [facility] [address] [place], [address] Phone: [phone]   Fax:  [phone]  Name: Natalie Monroe MRN: [id number] Date of Birth: 09-Nov-1950

## 2019-08-01 ENCOUNTER — Ambulatory Visit
Admission: RE | Admit: 2019-08-01 | Discharge: 2019-08-01 | Disposition: A | Discharge status: Home / Self Care | Payer: Medicare Other | Source: Ambulatory Visit | Attending: Oncology | Admitting: Oncology

## 2019-08-01 DIAGNOSIS — Z1231 Encounter for screening mammogram for malignant neoplasm of breast: Secondary | ICD-10-CM | POA: Insufficient documentation

## 2019-08-01 DIAGNOSIS — Z853 Personal history of malignant neoplasm of breast: Secondary | ICD-10-CM | POA: Insufficient documentation

## 2019-08-01 DIAGNOSIS — C50911 Malignant neoplasm of unspecified site of right female breast: Secondary | ICD-10-CM

## 2019-08-02 ENCOUNTER — Inpatient Hospital Stay (HOSPITAL_BASED_OUTPATIENT_CLINIC_OR_DEPARTMENT_OTHER): Payer: Medicare Other | Admitting: Oncology

## 2019-08-02 ENCOUNTER — Other Ambulatory Visit: Payer: Self-pay

## 2019-08-02 ENCOUNTER — Encounter: Payer: Self-pay | Admitting: Oncology

## 2019-08-02 ENCOUNTER — Inpatient Hospital Stay: Payer: Medicare Other | Attending: Oncology

## 2019-08-02 ENCOUNTER — Telehealth: Payer: Self-pay | Admitting: *Deleted

## 2019-08-02 VITALS — BP 147/85 | HR 71 | Temp 97.1°F | Resp 18 | Wt 131.0 lb

## 2019-08-02 DIAGNOSIS — C50911 Malignant neoplasm of unspecified site of right female breast: Secondary | ICD-10-CM

## 2019-08-02 DIAGNOSIS — Z853 Personal history of malignant neoplasm of breast: Secondary | ICD-10-CM | POA: Insufficient documentation

## 2019-08-02 DIAGNOSIS — M8580 Other specified disorders of bone density and structure, unspecified site: Secondary | ICD-10-CM | POA: Diagnosis not present

## 2019-08-02 DIAGNOSIS — Z87891 Personal history of nicotine dependence: Secondary | ICD-10-CM | POA: Diagnosis not present

## 2019-08-02 LAB — CBC WITH DIFFERENTIAL/PLATELET
Abs Immature Granulocytes: 0.02 10*3/uL (ref 0.00–0.07)
Basophils Absolute: 0.1 10*3/uL (ref 0.0–0.1)
Basophils Relative: 1 %
Eosinophils Absolute: 0.2 10*3/uL (ref 0.0–0.5)
Eosinophils Relative: 3 %
HCT: 41.8 % (ref 36.0–46.0)
Hemoglobin: 14.4 g/dL (ref 12.0–15.0)
Immature Granulocytes: 0 %
Lymphocytes Relative: 26 %
Lymphs Abs: 2.2 10*3/uL (ref 0.7–4.0)
MCH: 30.3 pg (ref 26.0–34.0)
MCHC: 34.4 g/dL (ref 30.0–36.0)
MCV: 88 fL (ref 80.0–100.0)
Monocytes Absolute: 0.8 10*3/uL (ref 0.1–1.0)
Monocytes Relative: 9 %
Neutro Abs: 5.1 10*3/uL (ref 1.7–7.7)
Neutrophils Relative %: 61 %
Platelets: 280 10*3/uL (ref 150–400)
RBC: 4.75 MIL/uL (ref 3.87–5.11)
RDW: 12.5 % (ref 11.5–15.5)
WBC: 8.5 10*3/uL (ref 4.0–10.5)
nRBC: 0 % (ref 0.0–0.2)

## 2019-08-02 LAB — COMPREHENSIVE METABOLIC PANEL
ALT: 23 U/L (ref 0–44)
AST: 18 U/L (ref 15–41)
Albumin: 4.6 g/dL (ref 3.5–5.0)
Alkaline Phosphatase: 78 U/L (ref 38–126)
Anion gap: 9 (ref 5–15)
BUN: 19 mg/dL (ref 8–23)
CO2: 26 mmol/L (ref 22–32)
Calcium: 9.4 mg/dL (ref 8.9–10.3)
Chloride: 106 mmol/L (ref 98–111)
Creatinine, Ser: 0.7 mg/dL (ref 0.44–1.00)
GFR calc Af Amer: >60 mL/min (ref >60)
GFR calc non Af Amer: >60 mL/min (ref >60)
Glucose, Bld: 120 mg/dL — ABNORMAL HIGH (ref 70–99)
Potassium: 3.7 mmol/L (ref 3.5–5.1)
Sodium: 141 mmol/L (ref 135–145)
Total Bilirubin: 0.9 mg/dL (ref 0.3–1.2)
Total Protein: 7.1 g/dL (ref 6.5–8.1)

## 2019-08-02 NOTE — Progress Notes (Signed)
Natalie Monroe:  08/02/19  Natalie Monroe is a 69 y.o. female with stage IIA right breast cancer who is seen for 1 year assessment.  PERTINENT ONCOLOGY HISTORY Natalie Monroe is a 69 y.o.afemale who has above oncology history reviewed by me today presented for follow up visit  Patient previously followed up with Dr. Mike Gip.  Establish care with me on 08/04/2018. Extensive medical records review was performed by me.  # 08/2004.Stage IIA right breast cancer s/p lumpectomy and axillary node dissection in   Pathology revealed a 3.2 cm invasive lobular carcinoma.  One of 13 lymph nodes were positive.  Tumor was ER positive and PR positive.  Pathologic stage was T2N1M0. Status post 6 cycles of Taxotere Adriamycin and Cytoxan.  Chemotherapy was completed in December 2006. Adjuvant radiation was completed in March 2007. Patient began anti-hormonal therapy with Arimidex in December 2006.  She was switched to tamoxifen secondary to side effects-arthralgia. Patient finished 5 years of tamoxifen and has been off since December 2011.  Patient has been on annual mammogram.  CA27.29 has been monitored.  Was 32.1 on 12/28/2009, 29.7 on 12/28/2010, 24.7 on 07/07/2011, 24.1 on 08/20/2014, 27.8 on 08/27/2015, and 22.5 on 07/25/2017.   Bone density study on 06/15/2005 revealed osteopenia with a T score of -1.1 in the right hip.  Bone density study on 10/29/2015 revealed osteopenia with a T score of -1.1 in the left femoral neck.   INTERVAL HISTORY Natalie Monroe is a 69 y.o. female who has above history reviewed by me today presents for concerns of her right axillary. Problems and complaints are listed below: Patient reports no new complaints today. 05/17/2019 unilateral right diagnostic mammogram was obtained for work-up of right axillary palpable area of concern.   Mammogram showed no mammographic or sonographic evidence of malignancy  in the right axilla.  New soft bulge in the right axilla correlates to soft tissue and muscle. 08/01/2019 bilateral screening mammogram showed no mammographic evidence of malignancy.  She was also seen by Dr. Peyton Najjar in April for left groin mass evaluation   Past Medical History:  Diagnosis Date  . Breast cancer [facility]) 2006   right breast, chemo and radiation  . Hypertension   . Personal history of chemotherapy   . Personal history of radiation therapy     Past Surgical History:  Procedure Laterality Date  . ABDOMINAL HYSTERECTOMY    . BREAST BIOPSY Bilateral 2006   left negative right positive  . BREAST LUMPECTOMY Right 2006   positive  . MASTECTOMY, PARTIAL Right   . OOPHORECTOMY      Family History  Problem Relation Age of Onset  . Breast cancer Maternal Aunt 70    Social History:  reports that she has quit smoking. She has never used smokeless tobacco. She reports that she does not use drugs. No history on file for alcohol use.  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  She lives in [place].  The patient is alone today.  Allergies:  Allergies  Allergen Reactions  . Covid-19 (Adenovirus) Vaccine Hives    Current Medications: Current Outpatient Medications  Medication Sig Dispense Refill  . acyclovir (ZOVIRAX) 400 MG tablet     . Aspirin-Acetaminophen-Caffeine (EXCEDRIN PO) Take by mouth.    Marland Kitchen atorvastatin (LIPITOR) 20 MG tablet Take 20 mg by mouth daily.  5  . Calcium Carb-Cholecalciferol (CALCIUM-VITAMIN D) 500-200 MG-UNIT tablet Take 1 tablet by mouth daily.    Marland Kitchen  cetirizine (ZYRTEC ALLERGY) 10 MG tablet     . Cholecalciferol (VITAMIN D3) 1000 units CAPS Take 1,000 Units by mouth daily.    . Coenzyme Q10 (COQ10) 100 MG CAPS Take 100 mg by mouth daily.    . metoprolol succinate (TOPROL-XL) 100 MG 24 hr tablet Take 100 mg by mouth daily.     . Probiotic Product (PROBIOTIC ADVANCED PO) Take 1 tablet by mouth daily.    . SUMAtriptan (IMITREX) 100 MG tablet TAKE 1  TABLET BY MOUTH AS DIRECTED AS NEEDED MAY REPEAT IN 2 HOURS IFNEEDED    . triamcinolone cream (KENALOG) 0.1 % APPLY SPARINGLY AND RUB IN WELL TO AFFECTED AREAS DAILY OR USE AS DIRECTED    . vitamin B-12 (CYANOCOBALAMIN) 1000 MCG tablet Take 500 mcg by mouth daily.     No current facility-administered medications for this visit.    Review of Systems  Constitutional: Negative for chills, fever, malaise/fatigue and weight loss.  HENT: Negative for sore throat.   Eyes: Negative for redness.  Respiratory: Negative for cough, shortness of breath and wheezing.   Cardiovascular: Negative for chest pain, palpitations and leg swelling.  Gastrointestinal: Negative for abdominal pain, blood in stool, nausea and vomiting.  Genitourinary: Negative for dysuria.  Musculoskeletal: Negative for myalgias.  Skin: Negative for rash.  Neurological: Negative for dizziness, tingling and tremors.  Endo/Heme/Allergies: Does not bruise/bleed easily.  Psychiatric/Behavioral: Negative for hallucinations.   Performance status (ECOG): 0 - Asymptomatic  Vital Signs BP (!) 147/85   Pulse 71   Temp (!) 97.1 F (36.2 C) (Tympanic)   Resp 18   Wt 131 lb (59.4 kg)  Physical Exam Constitutional:      General: She is not in acute distress.    Appearance: She is not diaphoretic.  HENT:     Head: Normocephalic and atraumatic.     Nose: Nose normal.     Mouth/Throat:     Pharynx: No oropharyngeal exudate.  Eyes:     General: No scleral icterus.    Pupils: Pupils are equal, round, and reactive to light.  Cardiovascular:     Rate and Rhythm: Normal rate and regular rhythm.     Heart sounds: No murmur heard.   Pulmonary:     Effort: Pulmonary effort is normal. No respiratory distress.     Breath sounds: No rales.  Chest:     Chest wall: No tenderness.  Abdominal:     General: There is no distension.     Palpations: Abdomen is soft.     Tenderness: There is no abdominal tenderness.  Musculoskeletal:         General: Normal range of motion.     Cervical back: Normal range of motion and neck supple.  Skin:    General: Skin is warm and dry.     Findings: No erythema.  Neurological:     Mental Status: She is alert and oriented to person, place, and time.     Cranial Nerves: No cranial nerve deficit.     Motor: No abnormal muscle tone.     Coordination: Coordination normal.  Psychiatric:        Mood and Affect: Affect normal.       Appointment on 08/02/2019  Component Date Value Ref Range Status  . Sodium 08/02/2019 141  135 - 145 mmol/L Final  . Potassium 08/02/2019 3.7  3.5 - 5.1 mmol/L Final  . Chloride 08/02/2019 106  98 - 111 mmol/L Final  . CO2 08/02/2019 26  22 - 32 mmol/L Final  . Glucose, Bld 08/02/2019 120* 70 - 99 mg/dL Final   Glucose reference range applies only to samples taken after fasting for at least 8 hours.  . BUN 08/02/2019 19  8 - 23 mg/dL Final  . Creatinine, Ser 08/02/2019 0.70  0.44 - 1.00 mg/dL Final  . Calcium 08/02/2019 9.4  8.9 - 10.3 mg/dL Final  . Total Protein 08/02/2019 7.1  6.5 - 8.1 g/dL Final  . Albumin 08/02/2019 4.6  3.5 - 5.0 g/dL Final  . AST 08/02/2019 18  15 - 41 U/L Final  . ALT 08/02/2019 23  0 - 44 U/L Final  . Alkaline Phosphatase 08/02/2019 78  38 - 126 U/L Final  . Total Bilirubin 08/02/2019 0.9  0.3 - 1.2 mg/dL Final  . GFR calc non Af Amer 08/02/2019 >60  >60 mL/min Final  . GFR calc Af Amer 08/02/2019 >60  >60 mL/min Final  . Anion gap 08/02/2019 9  5 - 15 Final   Performed at [facility], [address] [place], [place] [address]  . WBC 08/02/2019 8.5  4.0 - 10.5 K/uL Final  . RBC 08/02/2019 4.75  3.87 - 5.11 MIL/uL Final  . Hemoglobin 08/02/2019 14.4  12.0 - 15.0 g/dL Final  . HCT 08/02/2019 41.8  36 - 46 % Final  . MCV 08/02/2019 88.0  80.0 - 100.0 fL Final  . MCH 08/02/2019 30.3  26.0 - 34.0 pg Final  . MCHC 08/02/2019 34.4  30.0 - 36.0 g/dL Final  . RDW 08/02/2019 12.5  11.5 - 15.5 % Final  . Platelets 08/02/2019  280  150 - 400 K/uL Final  . nRBC 08/02/2019 0.0  0.0 - 0.2 % Final  . Neutrophils Relative % 08/02/2019 61  % Final  . Neutro Abs 08/02/2019 5.1  1.7 - 7.7 K/uL Final  . Lymphocytes Relative 08/02/2019 26  % Final  . Lymphs Abs 08/02/2019 2.2  0.7 - 4.0 K/uL Final  . Monocytes Relative 08/02/2019 9  % Final  . Monocytes Absolute 08/02/2019 0.8  0 - 1 K/uL Final  . Eosinophils Relative 08/02/2019 3  % Final  . Eosinophils Absolute 08/02/2019 0.2  0 - 0 K/uL Final  . Basophils Relative 08/02/2019 1  % Final  . Basophils Absolute 08/02/2019 0.1  0 - 0 K/uL Final  . Immature Granulocytes 08/02/2019 0  % Final  . Abs Immature Granulocytes 08/02/2019 0.02  0.00 - 0.07 K/uL Final   Performed at [facility], [address] [place], [place] [address]    [place] on 08/14/2015 revealed a hematocrit of 42.4, hemoglobin 14.0, MCV 90, platelets 254,000, white count 7100 with an [place] of 4600. Comprehensive metabolic panel included a sodium of 142, potassium 4.4, BUN 17, creatinine 0.61, calcium 9.8, albumin 4.6, bilirubin 0.6, SGOT 11, SGPT 11, and alkaline phosphatase 102. Labs on 08/12/2016 revealed a hematocrit of 39.8, hemoglobin 13.3, MCV 87, platelets 265,000, white count 7200 with an ANC of 4200. Comprehensive metabolic panel included a sodium of 144, potassium 4.4, BUN 18, creatinine 0.58, calcium 9.5, albumin 4.5, bilirubin 0.6, SGOT 13, SGPT 13, and alkaline phosphatase 78. Labs on 07/25/2017 revealed a hematocrit of 41.4, hemoglobin 13.9, MCV 88, platelets 268,000, white count 6900 with an ANC of 4200. Comprehensive metabolic panel included a sodium of 145, potassium 4.1, BUN 15, creatinine 0.92, calcium 9.7, albumin 4.6, bilirubin 0.6, SGOT 15, SGPT 14, and alkaline phosphatase 83. CA27.29 was 22.5.  Laboratory results,  I have independently reviewed below lab results. CBC    Component Value Date/Time   WBC 8.5 08/02/2019 0950   RBC 4.75 08/02/2019 0950   HGB 14.4  08/02/2019 0950   HGB 14.0 08/20/2014 0952   HCT 41.8 08/02/2019 0950   HCT 40.9 08/20/2014 0952   PLT 280 08/02/2019 0950   PLT 268 08/20/2014 0952   MCV 88.0 08/02/2019 0950   MCV 87 08/20/2014 0952   MCV 87 01/27/2013 0104   MCH 30.3 08/02/2019 0950   MCHC 34.4 08/02/2019 0950   RDW 12.5 08/02/2019 0950   RDW 13.6 08/20/2014 0952   RDW 13.8 01/27/2013 0104   LYMPHSABS 2.2 08/02/2019 0950   LYMPHSABS 2.3 08/20/2014 0952   LYMPHSABS 2.3 01/27/2013 0104   MONOABS 0.8 08/02/2019 0950   MONOABS 0.8 01/27/2013 0104   EOSABS 0.2 08/02/2019 0950   EOSABS 0.3 08/20/2014 0952   EOSABS 0.1 01/27/2013 0104   BASOSABS 0.1 08/02/2019 0950   BASOSABS 0.0 08/20/2014 0952   BASOSABS 0.1 01/27/2013 0104   CMP Latest Ref Rng & Units 08/02/2019 05/08/2019 08/03/2018  Glucose 70 - 99 mg/dL 120(H) 101(H) 105(H)  BUN 8 - 23 mg/dL 19 14 15   Creatinine 0.44 - 1.00 mg/dL 0.70 0.69 0.75  Sodium 135 - 145 mmol/L 141 143 144  Potassium 3.5 - 5.1 mmol/L 3.7 4.1 4.3  Chloride 98 - 111 mmol/L 106 105 109  CO2 22 - 32 mmol/L 26 29 26   Calcium 8.9 - 10.3 mg/dL 9.4 9.9 9.6  Total Protein 6.5 - 8.1 g/dL 7.1 7.3 7.1  Total Bilirubin 0.3 - 1.2 mg/dL 0.9 1.0 0.8  Alkaline Phos 38 - 126 U/L 78 65 76  AST 15 - 41 U/L 18 19 18   ALT 0 - 44 U/L 23 19 17     Assessment:  Natalie Monroe is a 69 y.o. female with history of stage IIa breast cancer presents for follow-up. 1. History of right breast cancer   2. Osteopenia, unspecified location    History of stage IIa right breast cancer in 2006, Continue annual screening mammogram.  Recent mammogram was independently reviewed by me and discussed with patient Labs reviewed and discussed with patient.  Follow-up in 1 year  Osteopenia, continue calcium and vitamin D supplementation..  Patient is due for repeat DEXA scan in October 2021.  Recommend patient to follow-up with primary and obtain it.  We spent sufficient time to discuss many aspect of care, questions  were answered to patient's satisfaction. Follow-up in 1 year  Earlie Server, MD  08/02/2019, 10:32 AM

## 2019-08-02 NOTE — Progress Notes (Signed)
Patient denies any concerns today.  

## 2019-08-02 NOTE — Telephone Encounter (Signed)
Error

## 2019-08-02 NOTE — Telephone Encounter (Signed)
1 year mammogram (no new issues)  lab/MD 2 days later per 08/02/19 los.  Mammogram can't be scheduled 1 year in advance, [place] only schedules out 6 months. Mammo will have to be scheduled 12mths from 08/02/19 date. All other appts were scheduled as requested but may have to be R/S depending on date pt has her mammo. She'll have to RTC 2 days after that date for Lab/MD.

## 2019-11-29 ENCOUNTER — Other Ambulatory Visit (HOSPITAL_COMMUNITY): Payer: Self-pay | Admitting: Neurology

## 2019-11-29 ENCOUNTER — Other Ambulatory Visit: Payer: Self-pay | Admitting: Neurology

## 2019-11-29 DIAGNOSIS — M5414 Radiculopathy, thoracic region: Secondary | ICD-10-CM

## 2019-11-29 DIAGNOSIS — G43119 Migraine with aura, intractable, without status migrainosus: Secondary | ICD-10-CM

## 2019-11-29 DIAGNOSIS — G8929 Other chronic pain: Secondary | ICD-10-CM

## 2019-12-22 ENCOUNTER — Other Ambulatory Visit: Payer: Self-pay

## 2019-12-22 ENCOUNTER — Ambulatory Visit
Admission: RE | Admit: 2019-12-22 | Discharge: 2019-12-22 | Disposition: A | Discharge status: Home / Self Care | Payer: Medicare Other | Source: Ambulatory Visit | Attending: Neurology | Admitting: Neurology

## 2019-12-22 DIAGNOSIS — G8929 Other chronic pain: Secondary | ICD-10-CM | POA: Diagnosis present

## 2019-12-22 DIAGNOSIS — M25512 Pain in left shoulder: Secondary | ICD-10-CM | POA: Diagnosis present

## 2019-12-22 DIAGNOSIS — G43119 Migraine with aura, intractable, without status migrainosus: Secondary | ICD-10-CM

## 2019-12-22 DIAGNOSIS — M5414 Radiculopathy, thoracic region: Secondary | ICD-10-CM | POA: Diagnosis present

## 2019-12-22 MED ORDER — GADOBUTROL 1 MMOL/ML IV SOLN
6.0000 mL | Freq: Once | INTRAVENOUS | Status: AC | PRN
  Administered 2019-12-22: 6 mL via INTRAVENOUS

## 2020-01-22 ENCOUNTER — Ambulatory Visit: Payer: PRIVATE HEALTH INSURANCE | Admitting: Dermatology

## 2020-03-19 ENCOUNTER — Ambulatory Visit (INDEPENDENT_AMBULATORY_CARE_PROVIDER_SITE_OTHER): Payer: Medicare Other | Admitting: Dermatology

## 2020-03-19 ENCOUNTER — Other Ambulatory Visit: Payer: Self-pay

## 2020-03-19 DIAGNOSIS — L821 Other seborrheic keratosis: Secondary | ICD-10-CM

## 2020-03-19 DIAGNOSIS — L814 Other melanin hyperpigmentation: Secondary | ICD-10-CM

## 2020-03-19 DIAGNOSIS — L578 Other skin changes due to chronic exposure to nonionizing radiation: Secondary | ICD-10-CM

## 2020-03-19 DIAGNOSIS — Z1283 Encounter for screening for malignant neoplasm of skin: Secondary | ICD-10-CM

## 2020-03-19 DIAGNOSIS — L719 Rosacea, unspecified: Secondary | ICD-10-CM | POA: Diagnosis not present

## 2020-03-19 DIAGNOSIS — D229 Melanocytic nevi, unspecified: Secondary | ICD-10-CM

## 2020-03-19 DIAGNOSIS — B009 Herpesviral infection, unspecified: Secondary | ICD-10-CM

## 2020-03-19 DIAGNOSIS — D1800 Hemangioma unspecified site: Secondary | ICD-10-CM

## 2020-03-19 DIAGNOSIS — L900 Lichen sclerosus et atrophicus: Secondary | ICD-10-CM

## 2020-03-19 DIAGNOSIS — D369 Benign neoplasm, unspecified site: Secondary | ICD-10-CM | POA: Diagnosis not present

## 2020-03-19 MED ORDER — ACYCLOVIR 400 MG PO TABS: 400.0000 mg | ORAL_TABLET | Freq: Two times a day (BID) | ORAL | 1 refills | Status: AC

## 2020-03-19 MED ORDER — DOXYCYCLINE HYCLATE 20 MG PO TABS: 20.0000 mg | ORAL_TABLET | Freq: Two times a day (BID) | ORAL | 2 refills | Status: AC

## 2020-03-19 NOTE — Progress Notes (Signed)
New Patient Visit  Subjective  Natalie Monroe is a 70 y.o. female who presents for the following: FBSE (Patient here for full body skin exam and skin cancer screening. Patient with no personal hx of skin cancer, there is a fhx skin cancer, not melanoma.).  Patient saw Dr. Koleen Nimrod then went to [facility] prior.  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 She uses TMC 0.1% cream QOD.  Patient also hays a hx of oral herpes and takes acyclovir daily as a preventative. She flares every few months.  Patient has some areas at face, left shoulder that she would like checked.  Patient has rosacea that has worsened over the last few years. Previous dermatologist prescribed creams that were too expensive. Patient uses The Ordinary Azelaic Acid and was using a sulfur soap but became very dry so she has discontinued the soap. She does get bumps and pimples.   The following portions of the chart were reviewed this encounter and updated as appropriate:   Tobacco  Allergies  Meds  Problems  Med Hx  Surg Hx  Fam Hx      Review of Systems:  No other skin or systemic complaints except as noted in HPI or Assessment and Plan.  Objective  Well appearing patient in no apparent distress; mood and affect are within normal limits.  A full examination was performed including scalp, head, eyes, ears, nose, lips, neck, chest, axillae, abdomen, back, buttocks, bilateral upper extremities, bilateral lower extremities, hands, feet, fingers, toes, fingernails, and toenails. All findings within normal limits unless otherwise noted below.  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  Continue acyclovir 400mg  BID  for suppression.   Pt getting flares every couple of months. Discussed option of switch to valacyclovir but she defers.  For flare, increase to Acyclovir 800 mg (2 tablets) three times daily for 2 days.    Ordered Medications: acyclovir (ZOVIRAX) 400 MG tablet  Rosacea face  Chronic condition with duration over one year. Condition is bothersome to patient. Currently flared.  Topicals have been too expensive in past.   Patient does have grittiness and irritation of the eyes, she does have dry eyes.   Discussed option of prescription doxycycline 20 mg twice daily with food.   Doxycycline should be taken with food to prevent nausea. Do not lay down for 30 minutes after taking. Be cautious with sun exposure and use good sun protection while on this medication. Pregnant women should not take this medication.   Patient defers doxycycline and will continue OTC azelaic acid.   Discussed BBL laser.   Rosacea is a chronic progressive skin condition usually affecting the face of adults, causing redness and/or acne bumps. It is treatable but not curable. It sometimes affects the eyes (ocular rosacea) as well. It may respond to topical and/or systemic medication and can flare with stress, sun exposure, alcohol, exercise and some foods.  Daily application of broad spectrum spf 30+ sunscreen to face is recommended to reduce flares.   Ordered Medications: doxycycline (PERIOSTAT) 20 MG tablet  Angiofibroma left alar crease, right ala  Benign-appearing.  Observation.  Call clinic for new or changing lesions.  Recommend daily  use of broad spectrum spf 30+ sunscreen to sun-exposed areas.    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 Currently well-controlled.  Was diagnosed before 2010.   Patient advises itching is controlled with TMC 0.1% cream every other day which she has been using chronically.   Discontinue TMC 0.1% cream  and monitor for symptoms.  Consider treating with anti-fungal (clotrimazole cream three times a day) if itching recurs. If not improving may restart TMC 0.1% cream.   Recheck in 2-3 months.   Lentigines - Scattered tan macules - Due to sun exposure - Benign-appering, observe - Recommend daily broad spectrum sunscreen SPF 30+ to sun-exposed areas, reapply every 2 hours as needed. - Call for any changes  Seborrheic Keratoses - Stuck-on, waxy, tan-brown papules and plaques right temple, chin - Discussed benign etiology and prognosis. - Observe - Call for any changes  Melanocytic Nevi - Tan-brown and/or pink-flesh-colored symmetric macules and papules - Benign appearing on exam today - Observation - Call clinic for new or changing moles - Recommend daily use of broad spectrum spf 30+ sunscreen to sun-exposed areas.   Hemangiomas - Red papules - Discussed benign nature - Observe - Call for any changes  Actinic Damage - Chronic, secondary to cumulative UV/sun exposure - diffuse scaly erythematous macules with underlying dyspigmentation - Recommend daily broad spectrum sunscreen SPF 30+ to sun-exposed areas, reapply every 2 hours as needed.  - Call for new or changing lesions.  Skin cancer screening performed today.   Return for 2-3 months lichen sclerosus follow up.  [name], RMA, am acting as scribe for Forest Gleason, MD .  Documentation: I have reviewed the above documentation for accuracy and completeness, and I agree with the above.  Forest Gleason, MD

## 2020-03-19 NOTE — Patient Instructions (Addendum)
Melanoma ABCDEs  Melanoma is the most dangerous type of skin cancer, and is the leading cause of death from skin disease.  You are more likely to develop melanoma if you:  Have light-colored skin, light-colored eyes, or red or blond hair  Spend a lot of time in the sun  Tan regularly, either outdoors or in a tanning bed  Have had blistering sunburns, especially during childhood  Have a close family member who has had a melanoma  Have atypical moles or large birthmarks  Early detection of melanoma is key since treatment is typically straightforward and cure rates are extremely high if we catch it early.   The first sign of melanoma is often a change in a mole or a new dark spot.  The ABCDE system is a way of remembering the signs of melanoma.  A for asymmetry:  The two halves do not match. B for border:  The edges of the growth are irregular. C for color:  A mixture of colors are present instead of an even brown color. D for diameter:  Melanomas are usually (but not always) greater than 21mm - the size of a pencil eraser. E for evolution:  The spot keeps changing in size, shape, and color.  Please check your skin once per month between visits. You can use a small mirror in front and a large mirror behind you to keep an eye on the back side or your body.   If you see any new or changing lesions before your next follow-up, please call to schedule a visit.  Please continue daily skin protection including broad spectrum sunscreen SPF 30+ to sun-exposed areas, reapplying every 2 hours as needed when you're outdoors.    Recommend taking Heliocare sun protection supplement daily in sunny weather for additional sun protection. For maximum protection on the sunniest days, you can take up to 2 capsules of regular Heliocare OR take 1 capsule of Heliocare Ultra. For prolonged exposure (such as a full day in the sun), you can repeat your dose of the supplement 4 hours after your first dose.  Heliocare can be purchased at [facility] or at [email or web address].    For vaginal or perianal itching start over the counter clotrimazole twice daily, if not resolved in 2 weeks restart TMC 0.1% cream.

## 2020-03-22 ENCOUNTER — Encounter: Payer: Self-pay | Admitting: Dermatology

## 2020-06-17 ENCOUNTER — Other Ambulatory Visit: Payer: Self-pay | Admitting: Oncology

## 2020-06-17 ENCOUNTER — Telehealth: Payer: Self-pay | Admitting: *Deleted

## 2020-06-17 DIAGNOSIS — Z1231 Encounter for screening mammogram for malignant neoplasm of breast: Secondary | ICD-10-CM

## 2020-06-17 NOTE — Telephone Encounter (Signed)
Order has already been entered.  Please schedule mammogram and inform patient of appt details.

## 2020-06-17 NOTE — Telephone Encounter (Signed)
Patient called reporting that she got notice that her mammogram is due, but when she called to schedule it, she was told that we need to send an order over before they will schedule it. Please let her know when order is in so that she can call to schedule mammo

## 2020-06-17 NOTE — Telephone Encounter (Signed)
Patient called back and said to disregard as it has been worked out

## 2020-07-08 ENCOUNTER — Encounter: Payer: Self-pay | Admitting: Dermatology

## 2020-07-08 ENCOUNTER — Ambulatory Visit (INDEPENDENT_AMBULATORY_CARE_PROVIDER_SITE_OTHER): Payer: Medicare Other | Admitting: Dermatology

## 2020-07-08 ENCOUNTER — Other Ambulatory Visit: Payer: Self-pay

## 2020-07-08 DIAGNOSIS — Q386 Other congenital malformations of mouth: Secondary | ICD-10-CM | POA: Diagnosis not present

## 2020-07-08 DIAGNOSIS — R21 Rash and other nonspecific skin eruption: Secondary | ICD-10-CM | POA: Diagnosis not present

## 2020-07-08 DIAGNOSIS — L900 Lichen sclerosus et atrophicus: Secondary | ICD-10-CM | POA: Diagnosis not present

## 2020-07-08 MED ORDER — HYDROCORTISONE 2.5 % EX CREA: TOPICAL_CREAM | Freq: Two times a day (BID) | CUTANEOUS | 1 refills | Status: DC | PRN

## 2020-07-08 NOTE — Progress Notes (Signed)
Follow-Up Visit   Subjective  Natalie Monroe is a 70 y.o. female who presents for the following: Follow-up (Patient here today for 3 month lichen sclerosus follow up. She discontinued TMC and has only been using clotrimazole. Patient advises she is itching mostly at perianal, vaginal area has improved. Patient concerned because in her early 20's she was diagnosed with genital warts in that area and they were burned off. ) and Skin Problem (Patient also has a spot at bottom lip that she would like checked. Present for at least 1 year, no symptoms. Patient also having cracking and extreme dryness at bottoms of feet. ).   The following portions of the chart were reviewed this encounter and updated as appropriate:   Tobacco  Allergies  Meds  Problems  Med Hx  Surg Hx  Fam Hx       Review of Systems:  No other skin or systemic complaints except as noted in HPI or Assessment and Plan.  Objective  Well appearing patient in no apparent distress; mood and affect are within normal limits.  A focused examination was performed including face, feet, vaginal and perianal area. Relevant physical exam findings are noted in the Assessment and Plan.  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  Exam more suggestive of irritant dermatitis today - no clear scarring. Patient has never had a biopsy.  Chronic condition with duration of years. Condition is bothersome to patient. Currently flared after discontinuation of TMC after last visit and despite topical clotrimazole  Patient advises she does have several bowel movements daily and does have hemorrhoids which  contributes to irritation in the area.   Start HC 2.5% cream twice a day for up to 1 week as needed.  Recommend using a zinc oxide protectant ointment or cream as needed.   Topical steroids (such as triamcinolone, fluocinolone, fluocinonide, mometasone, clobetasol, halobetasol, betamethasone, hydrocortisone) can cause thinning and lightening of the skin if they are used for too long in the same area. Your physician has selected the right strength medicine for your problem and area affected on the body. Please use your medication only as directed by your physician to prevent side effects.   If worsening, patient to call and may resume therapy with stronger topical steroid  Did discuss option of steroid-sparing agent/calcineurin inhibitor. However, due to theoretical increased risk of cancer in setting of possible LSetA, patient prefers to continue with topical steroid.  Related Medications hydrocortisone 2.5 % cream Apply topically 2 (two) times daily as needed (Rash). For up to 1 week as needed  Rash Right Foot - Anterior  Dermatitis vs tinea Discussed option of KOH today. Deferred.   Start clotrimazole cream twice daily to feet and in between toes. If not improving after 2 weeks may use Triamcinolone 0.1% cream twice a day as needed up to 2 weeks. (Patient has at home)  [place] spots left lower vermillion border  Benign-appearing.  Observation.  Call clinic for new or changing lesions.   Return in about 3 months (around 10/08/2020).  [name], RMA, am acting as scribe for Forest Gleason, MD .  Documentation: I have reviewed the above documentation for accuracy and completeness, and I  agree with the above.  Forest Gleason, MD

## 2020-07-08 NOTE — Patient Instructions (Signed)

## 2020-07-15 NOTE — Addendum Note (Signed)
Addended by: Mike Craze on: 07/15/2020 04:05 PM   Modules accepted: Orders

## 2020-08-03 ENCOUNTER — Ambulatory Visit
Admission: RE | Admit: 2020-08-03 | Discharge: 2020-08-03 | Disposition: A | Discharge status: Home / Self Care | Payer: Medicare Other | Source: Ambulatory Visit | Attending: Oncology | Admitting: Oncology

## 2020-08-03 ENCOUNTER — Other Ambulatory Visit: Payer: Self-pay

## 2020-08-03 DIAGNOSIS — Z1231 Encounter for screening mammogram for malignant neoplasm of breast: Secondary | ICD-10-CM | POA: Insufficient documentation

## 2020-08-04 ENCOUNTER — Other Ambulatory Visit: Payer: Self-pay

## 2020-08-04 DIAGNOSIS — Z853 Personal history of malignant neoplasm of breast: Secondary | ICD-10-CM

## 2020-08-04 DIAGNOSIS — M8580 Other specified disorders of bone density and structure, unspecified site: Secondary | ICD-10-CM

## 2020-08-05 ENCOUNTER — Inpatient Hospital Stay: Payer: Medicare Other | Attending: Oncology

## 2020-08-05 ENCOUNTER — Inpatient Hospital Stay (HOSPITAL_BASED_OUTPATIENT_CLINIC_OR_DEPARTMENT_OTHER): Payer: Medicare Other | Admitting: Oncology

## 2020-08-05 ENCOUNTER — Encounter: Payer: Self-pay | Admitting: Oncology

## 2020-08-05 VITALS — BP 144/75 | HR 71 | Temp 97.3°F | Resp 18 | Wt 127.0 lb

## 2020-08-05 DIAGNOSIS — Z9221 Personal history of antineoplastic chemotherapy: Secondary | ICD-10-CM | POA: Insufficient documentation

## 2020-08-05 DIAGNOSIS — M8580 Other specified disorders of bone density and structure, unspecified site: Secondary | ICD-10-CM | POA: Diagnosis not present

## 2020-08-05 DIAGNOSIS — Z87891 Personal history of nicotine dependence: Secondary | ICD-10-CM | POA: Diagnosis not present

## 2020-08-05 DIAGNOSIS — Z923 Personal history of irradiation: Secondary | ICD-10-CM | POA: Insufficient documentation

## 2020-08-05 DIAGNOSIS — Z853 Personal history of malignant neoplasm of breast: Secondary | ICD-10-CM | POA: Diagnosis present

## 2020-08-05 LAB — CBC WITH DIFFERENTIAL/PLATELET
Abs Immature Granulocytes: 0.02 10*3/uL (ref 0.00–0.07)
Basophils Absolute: 0.1 10*3/uL (ref 0.0–0.1)
Basophils Relative: 1 %
Eosinophils Absolute: 0.5 10*3/uL (ref 0.0–0.5)
Eosinophils Relative: 7 %
HCT: 40.5 % (ref 36.0–46.0)
Hemoglobin: 13.6 g/dL (ref 12.0–15.0)
Immature Granulocytes: 0 %
Lymphocytes Relative: 27 %
Lymphs Abs: 1.8 10*3/uL (ref 0.7–4.0)
MCH: 30.6 pg (ref 26.0–34.0)
MCHC: 33.6 g/dL (ref 30.0–36.0)
MCV: 91 fL (ref 80.0–100.0)
Monocytes Absolute: 0.6 10*3/uL (ref 0.1–1.0)
Monocytes Relative: 9 %
Neutro Abs: 3.6 10*3/uL (ref 1.7–7.7)
Neutrophils Relative %: 56 %
Platelets: 231 10*3/uL (ref 150–400)
RBC: 4.45 MIL/uL (ref 3.87–5.11)
RDW: 12.7 % (ref 11.5–15.5)
WBC: 6.6 10*3/uL (ref 4.0–10.5)
nRBC: 0 % (ref 0.0–0.2)

## 2020-08-05 LAB — COMPREHENSIVE METABOLIC PANEL
ALT: 18 U/L (ref 0–44)
AST: 19 U/L (ref 15–41)
Albumin: 4.4 g/dL (ref 3.5–5.0)
Alkaline Phosphatase: 73 U/L (ref 38–126)
Anion gap: 8 (ref 5–15)
BUN: 17 mg/dL (ref 8–23)
CO2: 27 mmol/L (ref 22–32)
Calcium: 9.3 mg/dL (ref 8.9–10.3)
Chloride: 105 mmol/L (ref 98–111)
Creatinine, Ser: 0.69 mg/dL (ref 0.44–1.00)
GFR, Estimated: >60 mL/min (ref >60)
Glucose, Bld: 99 mg/dL (ref 70–99)
Potassium: 4.1 mmol/L (ref 3.5–5.1)
Sodium: 140 mmol/L (ref 135–145)
Total Bilirubin: 0.8 mg/dL (ref 0.3–1.2)
Total Protein: 6.9 g/dL (ref 6.5–8.1)

## 2020-08-05 NOTE — Progress Notes (Signed)
[facility] day:  08/05/20  Chief Complaint: Natalie Monroe is a 70 y.o. female with stage IIA right breast cancer who is seen for 1 year assessment.  PERTINENT ONCOLOGY HISTORY Natalie Monroe is a 70 y.o.afemale who has above oncology history reviewed by me today presented for follow up visit  Patient previously followed up with Dr. Mike Gip.  Establish care with me on 08/04/2018. Extensive medical records review was performed by me.  # 08/2004.Stage IIA right breast cancer s/p lumpectomy and axillary node dissection in   Pathology revealed a 3.2 cm invasive lobular carcinoma.  One of 13 lymph nodes were positive.  Tumor was ER positive and PR positive.  Pathologic stage was T2N1M0. Status post 6 cycles of Taxotere Adriamycin and Cytoxan.  Chemotherapy was completed in December 2006. Adjuvant radiation was completed in March 2007. Patient began anti-hormonal therapy with Arimidex in December 2006.  She was switched to tamoxifen secondary to side effects-arthralgia. Patient finished 5 years of tamoxifen and has been off since December 2011.  Patient has been on annual mammogram.  CA27.29 has been monitored.  Was 32.1 on 12/28/2009, 29.7 on 12/28/2010, 24.7 on 07/07/2011, 24.1 on 08/20/2014, 27.8 on 08/27/2015, and 22.5 on 07/25/2017.   Bone density study on 06/15/2005 revealed osteopenia with a T score of -1.1 in the right hip.  Bone density study on 10/29/2015 revealed osteopenia with a T score of -1.1 in the left femoral neck.  05/17/2019 unilateral right diagnostic mammogram was obtained for work-up of right axillary palpable area of concern.   Mammogram showed no mammographic or sonographic evidence of malignancy in the right axilla.  New soft bulge in the right axilla correlates to soft tissue and muscle. 08/01/2019 bilateral screening mammogram showed no mammographic evidence of malignancy.  INTERVAL HISTORY Natalie Monroe is a 70  y.o. female who has above history reviewed by me today presents for follow-up of history of breast cancer.  Patient reports no new complaints today. She was also seen by Dr. Peyton Najjar in April for left groin mass evaluation, pathology showed lipoma. 08/03/2020 bilateral screening mammogram showed no mammographic evidence of malignancy.   Past Medical History:  Diagnosis Date   Breast cancer [facility]) 2006   right breast, chemo and radiation   Hypertension    Personal history of chemotherapy    Personal history of radiation therapy     Past Surgical History:  Procedure Laterality Date   ABDOMINAL HYSTERECTOMY     BREAST BIOPSY Bilateral 2006   left negative right positive   BREAST LUMPECTOMY Right 2006   positive   MASTECTOMY, PARTIAL Right    OOPHORECTOMY      Family History  Problem Relation Age of Onset   Breast cancer Maternal Aunt 70    Social History:  reports that she has quit smoking. She has never used smokeless tobacco. She reports that she does not use drugs. No history on file for alcohol use.  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  She lives in [place].  The patient is alone today.  Allergies:  Allergies  Allergen Reactions   Covid-19 (Adenovirus) Vaccine Hives    Current Medications: Current Outpatient Medications  Medication Sig Dispense Refill   acyclovir (ZOVIRAX) 400 MG tablet      Aspirin-Acetaminophen-Caffeine (EXCEDRIN PO) Take by mouth.     atorvastatin (LIPITOR) 20 MG tablet Take 20 mg by mouth daily.  5   Calcium Carb-Cholecalciferol (CALCIUM-VITAMIN D) 500-200 MG-UNIT  tablet Take 1 tablet by mouth daily.     Cholecalciferol (VITAMIN D3) 1000 units CAPS Take 1,000 Units by mouth daily.     Coenzyme Q10 (COQ10) 100 MG CAPS Take 100 mg by mouth daily.     hydrocortisone 2.5 % cream Apply topically 2 (two) times daily as needed (Rash). For up to 1 week as needed 30 g 1   metoprolol succinate (TOPROL-XL) 100 MG 24 hr tablet Take 100 mg by mouth daily.       Probiotic Product (PROBIOTIC ADVANCED PO) Take 1 tablet by mouth daily.     SUMAtriptan (IMITREX) 100 MG tablet TAKE 1 TABLET BY MOUTH AS DIRECTED AS NEEDED MAY REPEAT IN 2 HOURS IFNEEDED     triamcinolone cream (KENALOG) 0.1 % APPLY SPARINGLY AND RUB IN WELL TO AFFECTED AREAS DAILY OR USE AS DIRECTED     vitamin B-12 (CYANOCOBALAMIN) 1000 MCG tablet Take 500 mcg by mouth daily.     No current facility-administered medications for this visit.    Review of Systems  Constitutional:  Negative for chills, fever, malaise/fatigue and weight loss.  HENT:  Negative for sore throat.   Eyes:  Negative for redness.  Respiratory:  Negative for cough, shortness of breath and wheezing.   Cardiovascular:  Negative for chest pain, palpitations and leg swelling.  Gastrointestinal:  Negative for abdominal pain, blood in stool, nausea and vomiting.  Genitourinary:  Negative for dysuria.  Musculoskeletal:  Negative for myalgias.  Skin:  Negative for rash.  Neurological:  Negative for dizziness, tingling and tremors.  Endo/Heme/Allergies:  Does not bruise/bleed easily.  Psychiatric/Behavioral:  Negative for hallucinations.   Performance status (ECOG): 0 - Asymptomatic  Vital Signs BP (!) 144/75 (BP Location: Left Arm, Patient Position: Sitting)   Pulse 71   Temp (!) 97.3 F (36.3 C)   Resp 18   Wt 127 lb (57.6 kg)  Physical Exam Constitutional:      General: She is not in acute distress.    Appearance: She is not diaphoretic.  HENT:     Head: Normocephalic and atraumatic.     Nose: Nose normal.     Mouth/Throat:     Pharynx: No oropharyngeal exudate.  Eyes:     General: No scleral icterus.    Pupils: Pupils are equal, round, and reactive to light.  Cardiovascular:     Rate and Rhythm: Normal rate and regular rhythm.     Heart sounds: No murmur heard. Pulmonary:     Effort: Pulmonary effort is normal. No respiratory distress.     Breath sounds: No rales.  Chest:     Chest wall: No  tenderness.  Abdominal:     General: There is no distension.     Palpations: Abdomen is soft.     Tenderness: There is no abdominal tenderness.  Musculoskeletal:        General: Normal range of motion.     Cervical back: Normal range of motion and neck supple.  Skin:    General: Skin is warm and dry.     Findings: No erythema.  Neurological:     Mental Status: She is alert and oriented to person, place, and time.     Cranial Nerves: No cranial nerve deficit.     Motor: No abnormal muscle tone.     Coordination: Coordination normal.  Psychiatric:        Mood and Affect: Affect normal.         Laboratory results, I have independently  reviewed below lab results. CBC    Component Value Date/Time   WBC 6.6 08/05/2020 1240   RBC 4.45 08/05/2020 1240   HGB 13.6 08/05/2020 1240   HGB 14.0 08/20/2014 0952   HCT 40.5 08/05/2020 1240   HCT 40.9 08/20/2014 0952   PLT 231 08/05/2020 1240   PLT 268 08/20/2014 0952   MCV 91.0 08/05/2020 1240   MCV 87 08/20/2014 0952   MCV 87 01/27/2013 0104   MCH 30.6 08/05/2020 1240   MCHC 33.6 08/05/2020 1240   RDW 12.7 08/05/2020 1240   RDW 13.6 08/20/2014 0952   RDW 13.8 01/27/2013 0104   LYMPHSABS 1.8 08/05/2020 1240   LYMPHSABS 2.3 08/20/2014 0952   LYMPHSABS 2.3 01/27/2013 0104   MONOABS 0.6 08/05/2020 1240   MONOABS 0.8 01/27/2013 0104   EOSABS 0.5 08/05/2020 1240   EOSABS 0.3 08/20/2014 0952   EOSABS 0.1 01/27/2013 0104   BASOSABS 0.1 08/05/2020 1240   BASOSABS 0.0 08/20/2014 0952   BASOSABS 0.1 01/27/2013 0104   CMP Latest Ref Rng & Units 08/05/2020 08/02/2019 05/08/2019  Glucose 70 - 99 mg/dL 99 120(H) 101(H)  BUN 8 - 23 mg/dL 17 19 14   Creatinine 0.44 - 1.00 mg/dL 0.69 0.70 0.69  Sodium 135 - 145 mmol/L 140 141 143  Potassium 3.5 - 5.1 mmol/L 4.1 3.7 4.1  Chloride 98 - 111 mmol/L 105 106 105  CO2 22 - 32 mmol/L 27 26 29   Calcium 8.9 - 10.3 mg/dL 9.3 9.4 9.9  Total Protein 6.5 - 8.1 g/dL 6.9 7.1 7.3  Total Bilirubin 0.3 -  1.2 mg/dL 0.8 0.9 1.0  Alkaline Phos 38 - 126 U/L 73 78 65  AST 15 - 41 U/L 19 18 19   ALT 0 - 44 U/L 18 23 19     Assessment:  Natalie Monroe is a 70 y.o. female with history of stage IIa breast cancer presents for follow-up. 1. History of right breast cancer   2. Osteopenia, unspecified location    History of stage IIa right breast cancer in 2006 Patient has been doing very well clinically. Mammogram was reviewed and discussed with patient.  No radiographic evidence of malignancy. Labs reviewed and discussed. I discussed with patient about option of being discharged from oncology clinic and follow-up with primary care provider.  She agrees.  Recommend patient to ask primary care provider to continue annual screening mammogram. Osteopenia, continue calcium and vitamin D supplementation..  Patient is due for repeat DEXA scan and patient will discuss with primary care provider . We spent sufficient time to discuss many aspect of care, questions were answered to patient's satisfaction. No follow-up appointment will be scheduled at this point.  Earlie Server, MD  08/05/2020, 7:19 PM

## 2020-08-05 NOTE — Progress Notes (Signed)
Patient here for follow up. No new breast or medical problems.

## 2020-08-06 LAB — CANCER ANTIGEN 27.29: CA 27.29: 22.9 U/mL (ref 0.0–38.6)

## 2020-10-22 ENCOUNTER — Other Ambulatory Visit: Payer: Self-pay

## 2020-10-22 ENCOUNTER — Ambulatory Visit (INDEPENDENT_AMBULATORY_CARE_PROVIDER_SITE_OTHER): Payer: Medicare Other | Admitting: Dermatology

## 2020-10-22 DIAGNOSIS — L249 Irritant contact dermatitis, unspecified cause: Secondary | ICD-10-CM | POA: Diagnosis not present

## 2020-10-22 DIAGNOSIS — T7841XA Arthus phenomenon, initial encounter: Secondary | ICD-10-CM

## 2020-10-22 DIAGNOSIS — T50Z95A Adverse effect of other vaccines and biological substances, initial encounter: Secondary | ICD-10-CM | POA: Diagnosis not present

## 2020-10-22 DIAGNOSIS — B009 Herpesviral infection, unspecified: Secondary | ICD-10-CM | POA: Diagnosis not present

## 2020-10-22 DIAGNOSIS — L859 Epidermal thickening, unspecified: Secondary | ICD-10-CM

## 2020-10-22 MED ORDER — ACYCLOVIR 400 MG PO TABS: 400.0000 mg | ORAL_TABLET | Freq: Two times a day (BID) | ORAL | 1 refills | Status: DC

## 2020-10-22 NOTE — Patient Instructions (Addendum)
Recommend over the counter urea 40% cream QD. If too irritating decrease to 20% cream   If you have any questions or concerns for your doctor, please call our main line at [phone] and press option 4 to reach your doctor's medical assistant. If no one answers, please leave a voicemail as directed and we will return your call as soon as possible. Messages left after 4 pm will be answered the following business day.   You may also send [place] a message via [place]. We typically respond to MyChart messages within 1-2 business days.  For prescription refills, please ask your pharmacy to contact our office. Our fax number is [phone].  If you have an urgent issue when the clinic is closed that cannot wait until the next business day, you can page your doctor at the number below.    Please note that while we do our best to be available for urgent issues outside of office hours, we are not available 24/7.   If you have an urgent issue and are unable to reach [place], you may choose to seek medical care at your doctor's office, retail clinic, urgent care center, or emergency room.  If you have a medical emergency, please immediately call 911 or go to the emergency department.  Pager Numbers  - Dr. Nehemiah Massed: [phone]  - Dr. Laurence Ferrari: [phone]  - Dr. Nicole Kindred: [phone]  In the event of inclement weather, please call our main line at [phone] for an update on the status of any delays or closures.  Dermatology Medication Tips: Please keep the boxes that topical medications come in in order to help keep track of the instructions about where and how to use these. Pharmacies typically print the medication instructions only on the boxes and not directly on the medication tubes.   If your medication is too expensive, please contact our office at [phone] option 4 or send [place] a message through [place].   We are unable to tell what your co-pay for medications will be in advance as this is  different depending on your insurance coverage. However, we may be able to find a substitute medication at lower cost or fill out paperwork to get insurance to cover a needed medication.   If a prior authorization is required to get your medication covered by your insurance company, please allow [place] 1-2 business days to complete this process.  Drug prices often vary depending on where the prescription is filled and some pharmacies may offer cheaper prices.  The website www.goodrx.com contains coupons for medications through different pharmacies. The prices here do not account for what the cost may be with help from insurance (it may be cheaper with your insurance), but the website can give you the price if you did not use any insurance.  - You can print the associated coupon and take it with your prescription to the pharmacy.  - You may also stop by our office during regular business hours and pick up a GoodRx coupon card.  - If you need your prescription sent electronically to a different pharmacy, notify our office through [facility] or by phone at [phone] option 4.

## 2020-10-22 NOTE — Progress Notes (Signed)
   Follow-Up Visit   Subjective  Natalie Monroe is a 70 y.o. female who presents for the following: LS et A (Of the perianal area - much improved and no longer bothersome for patient. She has HC 2.5% cream PRN flares), tinea pedis (Patient currently using Clotrimazole cream on the feet but still has cracking/peeling. ), and Rash (On the L arm - patient had COVID and flu vaccine two days ago and now that arm is red).   The following portions of the chart were reviewed this encounter and updated as appropriate:   Tobacco  Allergies  Meds  Problems  Med Hx  Surg Hx  Fam Hx      Review of Systems:  No other skin or systemic complaints except as noted in HPI or Assessment and Plan.  Objective  Well appearing patient in no apparent distress; mood and affect are within normal limits.  A focused examination was performed including the L arm, face, and feet. Relevant physical exam findings are noted in the Assessment and Plan.  L upper arm Erythematous, edematous well-demarcated plaque  Lips Clear today.  perianal areas Clear today.  B/L foot Fissuring and thick skin of the feet.   Assessment & Plan  Adverse effect of vaccine, initial encounter L upper arm  Arthus Reaction Vaccine reaction - benign appearing. Use Tylenol or Ibuprofen as needed for pain. Should resolve on it's own. No evidence of infection today.  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 Currently well-controlled.  Continue Acyclovir 400mg  po QD. Will send for BID in case of outbreaks but can take daily if staying well-controlled.  acyclovir (ZOVIRAX) 400 MG tablet - Lips Take 1 tablet (400 mg total) by mouth 2 (two) times daily.  Irritant contact dermatitis, unspecified trigger perianal areas  Favored over LS et A -  Well-controlled. Continue Zinc oxide PRN and HC 2.5% cream to aa's BID up to 1-2 weeks PRN flares.  Hyperkeratosis B/L  foot  Recommend OTC urea 40% cream QD. If too irritating decrease strength to 20% cream. Consider Baby Foot treatment OTC.   No follow-ups on file.  Luther Redo, CMA, am acting as scribe for Forest Gleason, MD .  Documentation: I have reviewed the above documentation for accuracy and completeness, and I agree with the above.  Forest Gleason, MD

## 2020-11-04 ENCOUNTER — Encounter: Payer: Self-pay | Admitting: Dermatology

## 2021-01-06 ENCOUNTER — Other Ambulatory Visit: Payer: Self-pay | Admitting: Orthopedic Surgery

## 2021-01-06 DIAGNOSIS — M4802 Spinal stenosis, cervical region: Secondary | ICD-10-CM

## 2021-01-06 DIAGNOSIS — M75102 Unspecified rotator cuff tear or rupture of left shoulder, not specified as traumatic: Secondary | ICD-10-CM

## 2021-01-26 ENCOUNTER — Other Ambulatory Visit: Payer: Self-pay

## 2021-01-26 ENCOUNTER — Ambulatory Visit
Admission: RE | Admit: 2021-01-26 | Discharge: 2021-01-26 | Disposition: A | Discharge status: Home / Self Care | Payer: Medicare Other | Source: Ambulatory Visit | Attending: Orthopedic Surgery | Admitting: Orthopedic Surgery

## 2021-01-26 DIAGNOSIS — M4802 Spinal stenosis, cervical region: Secondary | ICD-10-CM | POA: Diagnosis present

## 2021-01-26 DIAGNOSIS — M75102 Unspecified rotator cuff tear or rupture of left shoulder, not specified as traumatic: Secondary | ICD-10-CM | POA: Insufficient documentation

## 2021-03-25 ENCOUNTER — Other Ambulatory Visit: Payer: Self-pay | Admitting: Family Medicine

## 2021-03-25 DIAGNOSIS — Z1231 Encounter for screening mammogram for malignant neoplasm of breast: Secondary | ICD-10-CM

## 2021-06-23 ENCOUNTER — Ambulatory Visit (INDEPENDENT_AMBULATORY_CARE_PROVIDER_SITE_OTHER): Payer: Medicare Other | Admitting: Dermatology

## 2021-06-23 DIAGNOSIS — L738 Other specified follicular disorders: Secondary | ICD-10-CM

## 2021-06-23 DIAGNOSIS — C4491 Basal cell carcinoma of skin, unspecified: Secondary | ICD-10-CM

## 2021-06-23 DIAGNOSIS — L900 Lichen sclerosus et atrophicus: Secondary | ICD-10-CM | POA: Diagnosis not present

## 2021-06-23 DIAGNOSIS — D485 Neoplasm of uncertain behavior of skin: Secondary | ICD-10-CM | POA: Diagnosis not present

## 2021-06-23 DIAGNOSIS — S00461A Insect bite (nonvenomous) of right ear, initial encounter: Secondary | ICD-10-CM

## 2021-06-23 DIAGNOSIS — D2262 Melanocytic nevi of left upper limb, including shoulder: Secondary | ICD-10-CM

## 2021-06-23 DIAGNOSIS — W57XXXA Bitten or stung by nonvenomous insect and other nonvenomous arthropods, initial encounter: Secondary | ICD-10-CM

## 2021-06-23 DIAGNOSIS — D229 Melanocytic nevi, unspecified: Secondary | ICD-10-CM

## 2021-06-23 DIAGNOSIS — L814 Other melanin hyperpigmentation: Secondary | ICD-10-CM

## 2021-06-23 HISTORY — DX: Basal cell carcinoma of skin, unspecified: C44.91

## 2021-06-23 MED ORDER — HYDROCORTISONE 2.5 % EX OINT: TOPICAL_OINTMENT | CUTANEOUS | 0 refills | Status: AC

## 2021-06-23 NOTE — Patient Instructions (Addendum)
Wound Care Instructions  Cleanse wound gently with soap and water once a day then pat dry with clean gauze. Apply a thing coat of Petrolatum (petroleum jelly, "Vaseline") over the wound (unless you have an allergy to this). We recommend that you use a new, sterile tube of Vaseline. Do not pick or remove scabs. Do not remove the yellow or white "healing tissue" from the base of the wound.  Cover the wound with fresh, clean, nonstick gauze and secure with paper tape. You may use Band-Aids in place of gauze and tape if the would is small enough, but would recommend trimming much of the tape off as there is often too much. Sometimes Band-Aids can irritate the skin.  You should call the office for your biopsy report after 1 week if you have not already been contacted.  If you experience any problems, such as abnormal amounts of bleeding, swelling, significant bruising, significant pain, or evidence of infection, please call the office immediately.  FOR ADULT SURGERY PATIENTS: If you need something for pain relief you may take 1 extra strength Tylenol (acetaminophen) AND 2 Ibuprofen (200mg each) together every 4 hours as needed for pain. (do not take these if you are allergic to them or if you have a reason you should not take them.) Typically, you may only need pain medication for 1 to 3 days.    Due to recent changes in healthcare laws, you may see results of your pathology and/or laboratory studies on MyChart before the doctors have had a chance to review them. We understand that in some cases there may be results that are confusing or concerning to you. Please understand that not all results are received at the same time and often the doctors may need to interpret multiple results in order to provide you with the best plan of care or course of treatment. Therefore, we ask that you please give us 2 business days to thoroughly review all your results before contacting the office for clarification. Should we  see a critical lab result, you will be contacted sooner.   If You Need Anything After Your Visit  If you have any questions or concerns for your doctor, please call our main line at [phone] and press option 4 to reach your doctor's medical assistant. If no one answers, please leave a voicemail as directed and we will return your call as soon as possible. Messages left after 4 pm will be answered the following business day.   You may also send us a message via MyChart. We typically respond to MyChart messages within 1-2 business days.  For prescription refills, please ask your pharmacy to contact our office. Our fax number is [phone].  If you have an urgent issue when the clinic is closed that cannot wait until the next business day, you can page your doctor at the number below.    Please note that while we do our best to be available for urgent issues outside of office hours, we are not available 24/7.   If you have an urgent issue and are unable to reach us, you may choose to seek medical care at your doctor's office, retail clinic, urgent care center, or emergency room.  If you have a medical emergency, please immediately call 911 or go to the emergency department.  Pager Numbers  - Dr. Kowalski: [phone]  - Dr. Moye: [phone]  - Dr. Stewart: [phone]  In the event of inclement weather, please call our main line at [phone]   for an update on the status of any delays or closures.  Dermatology Medication Tips: Please keep the boxes that topical medications come in in order to help keep track of the instructions about where and how to use these. Pharmacies typically print the medication instructions only on the boxes and not directly on the medication tubes.   If your medication is too expensive, please contact our office at [phone] option 4 or send us a message through MyChart.   We are unable to tell what your co-pay for medications will be in advance  as this is different depending on your insurance coverage. However, we may be able to find a substitute medication at lower cost or fill out paperwork to get insurance to cover a needed medication.   If a prior authorization is required to get your medication covered by your insurance company, please allow us 1-2 business days to complete this process.  Drug prices often vary depending on where the prescription is filled and some pharmacies may offer cheaper prices.  The website www.goodrx.com contains coupons for medications through different pharmacies. The prices here do not account for what the cost may be with help from insurance (it may be cheaper with your insurance), but the website can give you the price if you did not use any insurance.  - You can print the associated coupon and take it with your prescription to the pharmacy.  - You may also stop by our office during regular business hours and pick up a GoodRx coupon card.  - If you need your prescription sent electronically to a different pharmacy, notify our office through [facility] MyChart or by phone at [phone] option 4.     Si Usted Necesita Algo Despus de Su Visita  Tambin puede enviarnos un mensaje a travs de MyChart. Por lo general respondemos a los mensajes de MyChart en el transcurso de 1 a 2 das hbiles.  Para renovar recetas, por favor pida a su farmacia que se ponga en contacto con nuestra oficina. Nuestro nmero de fax es el [phone].  Si tiene un asunto urgente cuando la clnica est cerrada y que no puede esperar hasta el siguiente da hbil, puede llamar/localizar a su doctor(a) al nmero que aparece a continuacin.   Por favor, tenga en cuenta que aunque hacemos todo lo posible para estar disponibles para asuntos urgentes fuera del horario de oficina, no estamos disponibles las 24 horas del da, los 7 das de la semana.   Si tiene un problema urgente y no puede comunicarse con nosotros, puede optar  por buscar atencin mdica  en el consultorio de su doctor(a), en una clnica privada, en un centro de atencin urgente o en una sala de emergencias.  Si tiene una emergencia mdica, por favor llame inmediatamente al 911 o vaya a la sala de emergencias.  Nmeros de bper  - Dr. Kowalski: [phone]  - Dra. Moye: [phone]  - Dra. Stewart: [phone]  En caso de inclemencias del tiempo, por favor llame a nuestra lnea principal al [phone] para una actualizacin sobre el estado de cualquier retraso o cierre.  Consejos para la medicacin en dermatologa: Por favor, guarde las cajas en las que vienen los medicamentos de uso tpico para ayudarle a seguir las instrucciones sobre dnde y cmo usarlos. Las farmacias generalmente imprimen las instrucciones del medicamento slo en las cajas y no directamente en los tubos del medicamento.   Si su medicamento es muy caro, por favor, pngase en contacto con nuestra   oficina llamando al [phone] y presione la opcin 4 o envenos un mensaje a travs de MyChart.   No podemos decirle cul ser su copago por los medicamentos por adelantado ya que esto es diferente dependiendo de la cobertura de su seguro. Sin embargo, es posible que podamos encontrar un medicamento sustituto a menor costo o llenar un formulario para que el seguro cubra el medicamento que se considera necesario.   Si se requiere una autorizacin previa para que su compaa de seguros cubra su medicamento, por favor permtanos de 1 a 2 das hbiles para completar este proceso.  Los precios de los medicamentos varan con frecuencia dependiendo del lugar de dnde se surte la receta y alguna farmacias pueden ofrecer precios ms baratos.  El sitio web www.goodrx.com tiene cupones para medicamentos de diferentes farmacias. Los precios aqu no tienen en cuenta lo que podra costar con la ayuda del seguro (puede ser ms barato con su seguro), pero el sitio web puede darle el precio si  no utiliz ningn seguro.  - Puede imprimir el cupn correspondiente y llevarlo con su receta a la farmacia.  - Tambin puede pasar por nuestra oficina durante el horario de atencin regular y recoger una tarjeta de cupones de GoodRx.  - Si necesita que su receta se enve electrnicamente a una farmacia diferente, informe a nuestra oficina a travs de MyChart de [place] o por telfono llamando al [phone] y presione la opcin 4.  

## 2021-06-23 NOTE — Progress Notes (Signed)
Follow-Up Visit   Subjective  Natalie Monroe is a 71 y.o. female who presents for the following: Irregular skin lesions (L shoulder, R nose ) and Tick bite (On the R ear - pulled tick off of ear three days ago. No s/e of h/a, fever, chills, etc.).  Spot on nose has grown.   The following portions of the chart were reviewed this encounter and updated as appropriate:       Review of Systems:  No other skin or systemic complaints except as noted in HPI or Assessment and Plan.  Objective  Well appearing patient in no apparent distress; mood and affect are within normal limits.  A focused examination was performed including the face, ears, back. Relevant physical exam findings are noted in the Assessment and Plan.  Pubic Not examined today, pt states she is well-controlled  L shoulder 0.4 cm firm flesh brown papule.   Right Ear Clear.  R inf nasal ala 0.2 cm firm pink flesh papule.       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 Currently well-controlled.   Patient requesting refills of HC 2.5% ointment. Sent to pharmacy.   Lichen sclerosus is a chronic inflammatory condition of unknown cause that frequently involves the vaginal area and less commonly extragenital skin, and is NOT sexually transmitted. It frequently causes symptoms of pain and burning.  It requires regular monitoring and treatment with topical steroids to minimize inflammation and to reduce risk of scarring. There is also a risk of cancer in the vaginal area which is very low if inflammation is well controlled. Regular checks of the area are recommended. Please call if you notice any new or changing spots within this area.   hydrocortisone 2.5 % ointment - Pubic Apply to aa's rash BID up to one week.  Nevus L shoulder  With possible underlying cyst. Benign-appearing.  Observation.  Call clinic for new or changing  moles.  Recommend daily use of broad spectrum spf 30+ sunscreen to sun-exposed areas.   Tick bite of right ear, initial encounter Right Ear  H/o tick bite, clear today. No treatment needed.  Neoplasm of uncertain behavior of skin R inf nasal ala  Epidermal / dermal shaving  Lesion diameter (cm):  0.3 Informed consent: discussed and consent obtained   Patient was prepped and draped in usual sterile fashion: area prepped with alcohol. Anesthesia: the lesion was anesthetized in a standard fashion   Anesthetic:  1% lidocaine w/ epinephrine 1-100,000 buffered w/ 8.4% NaHCO3 Instrument used: DermaBlade and scissors   Hemostasis achieved with: pressure, aluminum chloride and electrodesiccation   Outcome: patient tolerated procedure well   Post-procedure details: wound care instructions given   Post-procedure details comment:  Ointment and small bandage applied  Specimen 1 - Surgical pathology Differential Diagnosis: D48.5 fibrous papule/angiofibroma vs other  2 pieces  Check Margins: No   Melanocytic Nevi - Tan-brown and/or pink-flesh-colored symmetric macules and papules - Benign appearing on exam today - Observation - Call clinic for new or changing moles - Recommend daily use of broad spectrum spf 30+ sunscreen to sun-exposed areas.   Lentigines - Scattered tan macules - Due to sun exposure - Benign-appering, observe - Recommend daily broad spectrum sunscreen SPF 30+ to sun-exposed areas, reapply every 2 hours as needed. - Call for any changes  Sebaceous Hyperplasia - Small yellow papules with a central dell - Benign - Observe  Return for  appointment as scheduled.  Luther Redo, CMA, am acting as scribe for Brendolyn Patty, MD .  Documentation: I have reviewed the above documentation for accuracy and completeness, and I agree with the above.  Brendolyn Patty MD

## 2021-06-28 ENCOUNTER — Telehealth: Payer: Self-pay

## 2021-06-28 NOTE — Telephone Encounter (Signed)
-----   Message from Brendolyn Patty, MD sent at 06/28/2021  1:31 PM EDT ----- Skin , right inf nasal ala BASAL CELL CARCINOMA, NODULAR AND INFILTRATIVE PATTERNS, BASE INVOLVED   BCC skin cancer- needs EDC  - please call patient

## 2021-06-28 NOTE — Telephone Encounter (Signed)
Advised patient biopsy of the right inf nasal ala was BCC and needs EDC. Scheduled 08/10/21 at 1:30pm.

## 2021-06-29 ENCOUNTER — Other Ambulatory Visit: Payer: Self-pay

## 2021-06-29 ENCOUNTER — Emergency Department: Payer: Medicare Other

## 2021-06-29 ENCOUNTER — Emergency Department
Admission: EM | Admit: 2021-06-29 | Discharge: 2021-06-29 | Disposition: A | Discharge status: Home / Self Care | Payer: Medicare Other | Attending: Emergency Medicine | Admitting: Emergency Medicine

## 2021-06-29 DIAGNOSIS — Z79899 Other long term (current) drug therapy: Secondary | ICD-10-CM | POA: Insufficient documentation

## 2021-06-29 DIAGNOSIS — I10 Essential (primary) hypertension: Secondary | ICD-10-CM | POA: Diagnosis not present

## 2021-06-29 DIAGNOSIS — R519 Headache, unspecified: Secondary | ICD-10-CM | POA: Diagnosis present

## 2021-06-29 LAB — COMPREHENSIVE METABOLIC PANEL
ALT: 21 U/L (ref 0–44)
AST: 21 U/L (ref 15–41)
Albumin: 4.8 g/dL (ref 3.5–5.0)
Alkaline Phosphatase: 73 U/L (ref 38–126)
Anion gap: 11 (ref 5–15)
BUN: 16 mg/dL (ref 8–23)
CO2: 27 mmol/L (ref 22–32)
Calcium: 9.7 mg/dL (ref 8.9–10.3)
Chloride: 106 mmol/L (ref 98–111)
Creatinine, Ser: 0.75 mg/dL (ref 0.44–1.00)
GFR, Estimated: >60 mL/min (ref >60)
Glucose, Bld: 98 mg/dL (ref 70–99)
Potassium: 3.5 mmol/L (ref 3.5–5.1)
Sodium: 144 mmol/L (ref 135–145)
Total Bilirubin: 0.9 mg/dL (ref 0.3–1.2)
Total Protein: 7.5 g/dL (ref 6.5–8.1)

## 2021-06-29 LAB — CBC WITH DIFFERENTIAL/PLATELET
Abs Immature Granulocytes: 0.02 10*3/uL (ref 0.00–0.07)
Basophils Absolute: 0 10*3/uL (ref 0.0–0.1)
Basophils Relative: 1 %
Eosinophils Absolute: 0.1 10*3/uL (ref 0.0–0.5)
Eosinophils Relative: 2 %
HCT: 44.7 % (ref 36.0–46.0)
Hemoglobin: 14.5 g/dL (ref 12.0–15.0)
Immature Granulocytes: 0 %
Lymphocytes Relative: 28 %
Lymphs Abs: 2 10*3/uL (ref 0.7–4.0)
MCH: 29.7 pg (ref 26.0–34.0)
MCHC: 32.4 g/dL (ref 30.0–36.0)
MCV: 91.6 fL (ref 80.0–100.0)
Monocytes Absolute: 0.4 10*3/uL (ref 0.1–1.0)
Monocytes Relative: 6 %
Neutro Abs: 4.6 10*3/uL (ref 1.7–7.7)
Neutrophils Relative %: 63 %
Platelets: 232 10*3/uL (ref 150–400)
RBC: 4.88 MIL/uL (ref 3.87–5.11)
RDW: 12.7 % (ref 11.5–15.5)
WBC: 7.2 10*3/uL (ref 4.0–10.5)
nRBC: 0 % (ref 0.0–0.2)

## 2021-06-29 LAB — TROPONIN I (HIGH SENSITIVITY): Troponin I (High Sensitivity): 5 ng/L (ref <18)

## 2021-06-29 MED ORDER — ACETAMINOPHEN 325 MG PO TABS
650.0000 mg | ORAL_TABLET | Freq: Once | ORAL | Status: AC
  Administered 2021-06-29: 650 mg via ORAL
  Filled 2021-06-29: qty 2

## 2021-06-29 NOTE — ED Triage Notes (Addendum)
Pt comes with c/o HTN. Pt states she went to Neurologist appt and it was elevated. Pt states they rechecked it again later and it was in 130s. Pt states she was informed to come here for evaluation. Pt states headache. Pt denies any blurry vision or CP.  Pt states she does have hx of HTN and takes medications.

## 2021-06-29 NOTE — ED Notes (Signed)
Upon entering the pt's room to start an iv and obtain labs the pt advised she had accidentally fell in the floor. The pt advised she was leaning to reach the chair when she rolled out of the bed and landed on the floor on her right hip. The pt denies any pain, provider notified.

## 2021-06-29 NOTE — Discharge Instructions (Signed)
Your EKG, blood work, and CT scan are normal.  Please follow-up with your primary care provider to discuss your blood pressure and medication management.  You may also take your blood pressure at home to see what your blood pressure is like when you do not have a headache.  Please return for any new, worsening, or change in symptoms or other concerns.  It was a pleasure caring for you today.

## 2021-06-29 NOTE — ED Provider Notes (Cosign Needed)
[facility] Provider Note    Event Date/Time   First MD Initiated Contact with Patient 06/29/21 1503     (approximate)   History   Hypertension   HPI  Natalie Monroe is a 71 y.o. 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 Patient reports that she went to see neurology for her thoracic radiculopathy and they incidentally noted that her BP was elevated to 152/99. She reports that they took her BP manually and noted that it was 737T systolic.  Patient reports that she has a headache, but reports that she has headaches daily for many years and today does not feel different. She denies nausea or vomiting. She reports that she felt slightly dizzy when she got out of bed, but this has resolved. She has a history of vertigo. She takes metoprolol nightly for her diagnosis of migraines, and has not taken it today. She reports that her PCP considered starting her on an additional BP medication a couple of years ago, though she opted to attempt lifestyle changes first. She denies CP/SOB, N/V, abdominal pain, vision changes, or back pain different than normal. She reports that she currently feels at her baseline with the exception of feeling "slightly anxious."  Patient Active Problem List   Diagnosis Date Noted   Vertigo 05/29/2018   Essential hypertension 04/13/2015   Breast CA ([place]) 08/06/2014   Abnormal mental state 05/22/2013   Amnesia, global, transient 05/22/2013   Carpal tunnel syndrome 03/28/2013   Cephalalgia 03/28/2013   Hypercholesterolemia without hypertriglyceridemia 03/28/2013          Physical Exam   Triage Vital Signs: ED Triage Vitals  Enc Vitals Group     BP 06/29/21 1425 (!) 182/94     Pulse Rate 06/29/21 1425 78     Resp 06/29/21 1425 16     Temp 06/29/21 1425 98.3 F (36.8 C)     Temp Source 06/29/21  1425 Oral     SpO2 06/29/21 1425 98 %     Weight --      Height --      Head Circumference --      Peak Flow --      Pain Score 06/29/21 1355 2     Pain Loc --      Pain Edu? --      Excl. in [place]? --     Most recent vital signs: Vitals:   06/29/21 1425 06/29/21 1628  BP: (!) 182/94 (!) 159/88  Pulse: 78 70  Resp: 16 16  Temp: 98.3 F (36.8 C)   SpO2: 98% 100%    Physical Exam Vitals and nursing note reviewed.  Constitutional:      General: Awake and alert. No acute distress.    Appearance: Normal appearance. He is well-developed and normal weight.  HENT:     Head: Normocephalic and atraumatic.     Mouth: Mucous membranes are moist.  Eyes:     General: PERRL. Normal EOMs        Right eye: No discharge.        Left eye: No discharge.     Conjunctiva/sclera: Conjunctivae normal.  Cardiovascular:     Rate and Rhythm: Normal rate and regular rhythm.     Pulses: Normal pulses. Equal in all 4 extremities    Heart sounds: Normal heart sounds Pulmonary:  Effort: Pulmonary effort is normal. No respiratory distress.     Breath sounds: Normal breath sounds.  Abdominal:     Abdomen is soft. There is no abdominal tenderness. No rebound or guarding. No distention. Musculoskeletal:        General: No swelling. Normal range of motion.     Cervical back: Normal range of motion and neck supple.  Lymphadenopathy:     Cervical: No cervical adenopathy.  Skin:    General: Skin is warm and dry.     Capillary Refill: Capillary refill takes less than 2 seconds.     Findings: No rash.  Neurological:     Mental Status: She is alert.  Neurological: GCS 15 alert and oriented x3 Normal speech, no expressive or receptive aphasia or dysarthria Cranial nerves II through XII intact Normal visual fields 5 out of 5 strength in all 4 extremities with intact sensation throughout No extremity drift Normal finger-to-nose testing, no limb or truncal ataxia     ED Results / Procedures /  Treatments   Labs (all labs ordered are listed, but only abnormal results are displayed) Labs Reviewed  CBC WITH DIFFERENTIAL/PLATELET  COMPREHENSIVE METABOLIC PANEL  TROPONIN I (HIGH SENSITIVITY)     EKG     RADIOLOGY     PROCEDURES:  Critical Care performed:   Procedures   MEDICATIONS ORDERED IN ED: Medications  acetaminophen (TYLENOL) tablet 650 mg (650 mg Oral Given 06/29/21 1628)     IMPRESSION / MDM / ASSESSMENT AND PLAN / ED COURSE  I reviewed the triage vital signs and the nursing notes.   Differential diagnosis includes, but is not limited to, elevated BP, ICH, vertigo, ACS, less likely dissection given that blood pressures are equal bilaterally with equal bilateral radial and pedal pulses, and patient has no chest pain or back pain unchanged from her chronic pain (has chronic thoracic radiculopathy). Currently she has no pain.  No focal neurological deficits.  EKG obtained at triage demonstrates no acute ischemia, improved from prior obtained in 2015.  CT head normal.  Patient was treated with Tylenol with complete resolution of her headache.  Labs reveal no evidence of endorgan damage. I recommended that she follow up with her PCP for recheck and blood pressure management. I considered initiating antihypertensive therapy in the ER but she is already on a high dose of metoprolol, and if BP improves when her anxiety improves and her pain resolve, then there would be concern about dropping her BP too much. I encouraged her to check her BP when she is feeling relaxed and pain free. In the meantime, we discussed strict return precautions. Patient understands and agrees with plan.  Patient discussed with Dr. Ellender Hose who agrees with plan.   Patient's presentation is most consistent with acute complicated illness / injury requiring diagnostic workup.   Clinical Course as of 06/29/21 2042  Tue Jun 29, 2021  1650 I was informed by the RN that the patient was leaning to  reach something on the table from her stretcher, and rolled out of the stretcher and landed on the ground.  There was no head strike or LOC.  Patient reports that she landed on her right hip.  She denies any pain currently.  She is able to ambulate.  Full and normal range of motion with active and passive range of motion of hip and knee. [JP]  0814 Patient reports that her headache has resolved after the Tylenol [JP]    Clinical Course User Index [JP]  Miroslava Santellan, Clarnce Flock, PA-C     FINAL CLINICAL IMPRESSION(S) / ED DIAGNOSES   Final diagnoses:  Hypertension, unspecified type     Rx / DC Orders   ED Discharge Orders     None        Note:  This document was prepared using Dragon voice recognition software and may include unintentional dictation errors.   Marquette Old, PA-C 06/29/21 2042

## 2021-06-29 NOTE — ED Notes (Signed)
See triage note .  States she was seen by her MD earlier  they noticed that her b/p was elevated   was sent here for further w/u  having slight h/a  but states she has h/a's daily

## 2021-06-30 ENCOUNTER — Other Ambulatory Visit: Payer: Self-pay | Admitting: Student

## 2021-06-30 DIAGNOSIS — M5414 Radiculopathy, thoracic region: Secondary | ICD-10-CM

## 2021-07-12 ENCOUNTER — Ambulatory Visit
Admission: RE | Admit: 2021-07-12 | Discharge: 2021-07-12 | Disposition: A | Discharge status: Home / Self Care | Payer: Medicare Other | Source: Ambulatory Visit | Attending: Student | Admitting: Student

## 2021-07-12 DIAGNOSIS — M5414 Radiculopathy, thoracic region: Secondary | ICD-10-CM | POA: Insufficient documentation

## 2021-08-04 ENCOUNTER — Ambulatory Visit
Admission: RE | Admit: 2021-08-04 | Discharge: 2021-08-04 | Disposition: A | Discharge status: Home / Self Care | Payer: Medicare Other | Source: Ambulatory Visit | Attending: Family Medicine | Admitting: Family Medicine

## 2021-08-04 DIAGNOSIS — Z1231 Encounter for screening mammogram for malignant neoplasm of breast: Secondary | ICD-10-CM | POA: Diagnosis present

## 2021-08-10 ENCOUNTER — Encounter: Payer: Self-pay | Admitting: Dermatology

## 2021-08-10 ENCOUNTER — Ambulatory Visit (INDEPENDENT_AMBULATORY_CARE_PROVIDER_SITE_OTHER): Payer: Medicare Other | Admitting: Dermatology

## 2021-08-10 DIAGNOSIS — L720 Epidermal cyst: Secondary | ICD-10-CM | POA: Diagnosis not present

## 2021-08-10 DIAGNOSIS — L578 Other skin changes due to chronic exposure to nonionizing radiation: Secondary | ICD-10-CM | POA: Diagnosis not present

## 2021-08-10 DIAGNOSIS — C44311 Basal cell carcinoma of skin of nose: Secondary | ICD-10-CM | POA: Diagnosis not present

## 2021-08-10 DIAGNOSIS — L723 Sebaceous cyst: Secondary | ICD-10-CM

## 2021-08-10 MED ORDER — MUPIROCIN 2 % EX OINT: TOPICAL_OINTMENT | CUTANEOUS | 0 refills | Status: DC

## 2021-08-10 NOTE — Patient Instructions (Signed)
Wound Care Instructions  Cleanse wound gently with soap and water once a day then pat dry with clean gauze. Apply a thing coat of Petrolatum (petroleum jelly, "Vaseline") over the wound (unless you have an allergy to this). We recommend that you use a new, sterile tube of Vaseline. Do not pick or remove scabs. Do not remove the yellow or white "healing tissue" from the base of the wound.  Cover the wound with fresh, clean, nonstick gauze and secure with paper tape. You may use Band-Aids in place of gauze and tape if the would is small enough, but would recommend trimming much of the tape off as there is often too much. Sometimes Band-Aids can irritate the skin.  If you experience any problems, such as abnormal amounts of bleeding, swelling, significant bruising, significant pain, or evidence of infection, please call the office immediately.  FOR ADULT SURGERY PATIENTS: If you need something for pain relief you may take 1 extra strength Tylenol (acetaminophen) AND 2 Ibuprofen ('200mg'$  each) together every 4 hours as needed for pain. (do not take these if you are allergic to them or if you have a reason you should not take them.) Typically, you may only need pain medication for 1 to 3 days.      Due to recent changes in healthcare laws, you may see results of your pathology and/or laboratory studies on MyChart before the doctors have had a chance to review them. We understand that in some cases there may be results that are confusing or concerning to you. Please understand that not all results are received at the same time and often the doctors may need to interpret multiple results in order to provide you with the best plan of care or course of treatment. Therefore, we ask that you please give [place] 2 business days to thoroughly review all your results before contacting the office for clarification. Should we see a critical lab result, you will be contacted sooner.   If You Need Anything After Your  Visit  If you have any questions or concerns for your doctor, please call our main line at [phone] and press option 4 to reach your doctor's medical assistant. If no one answers, please leave a voicemail as directed and we will return your call as soon as possible. Messages left after 4 pm will be answered the following business day.   You may also send [place] a message via [place]. We typically respond to MyChart messages within 1-2 business days.  For prescription refills, please ask your pharmacy to contact our office. Our fax number is [phone].  If you have an urgent issue when the clinic is closed that cannot wait until the next business day, you can page your doctor at the number below.    Please note that while we do our best to be available for urgent issues outside of office hours, we are not available 24/7.   If you have an urgent issue and are unable to reach [place], you may choose to seek medical care at your doctor's office, retail clinic, urgent care center, or emergency room.  If you have a medical emergency, please immediately call 911 or go to the emergency department.  Pager Numbers  - Dr. Nehemiah Massed: [phone]  - Dr. Laurence Ferrari: [phone]  - Dr. Nicole Kindred: [phone]  In the event of inclement weather, please call our main line at [phone] for an update on the status of any delays or closures.  Dermatology Medication Tips: Please keep the  boxes that topical medications come in in order to help keep track of the instructions about where and how to use these. Pharmacies typically print the medication instructions only on the boxes and not directly on the medication tubes.   If your medication is too expensive, please contact our office at [phone] option 4 or send [place] a message through [place].   We are unable to tell what your co-pay for medications will be in advance as this is different depending on your insurance coverage. However, we may be able to find a  substitute medication at lower cost or fill out paperwork to get insurance to cover a needed medication.   If a prior authorization is required to get your medication covered by your insurance company, please allow [place] 1-2 business days to complete this process.  Drug prices often vary depending on where the prescription is filled and some pharmacies may offer cheaper prices.  The website www.goodrx.com contains coupons for medications through different pharmacies. The prices here do not account for what the cost may be with help from insurance (it may be cheaper with your insurance), but the website can give you the price if you did not use any insurance.  - You can print the associated coupon and take it with your prescription to the pharmacy.  - You may also stop by our office during regular business hours and pick up a GoodRx coupon card.  - If you need your prescription sent electronically to a different pharmacy, notify our office through [facility] or by phone at [phone] option 4.     Si Usted Necesita Algo Despus de Su Visita  Tambin puede enviarnos un mensaje a travs de Pharmacist, community. Por lo general respondemos a los mensajes de MyChart en el transcurso de 1 a 2 das hbiles.  Para renovar recetas, por favor pida a su farmacia que se ponga en contacto con nuestra oficina. Harland Dingwall de fax es [place] [phone].  Si tiene un asunto urgente cuando la clnica est cerrada y que no puede esperar hasta el siguiente da hbil, puede llamar/localizar a su doctor(a) al nmero que aparece a continuacin.   Por favor, tenga en cuenta que aunque hacemos todo lo posible para estar disponibles para asuntos urgentes fuera del horario de [place], no estamos disponibles las 24 horas del da, los 7 das de la [place].   Si tiene un problema urgente y no puede comunicarse con nosotros, puede optar por buscar atencin mdica  en el consultorio de su doctor(a), en una clnica privada, en un  centro de atencin urgente o en una sala de emergencias.  Si tiene Engineering geologist, por favor llame inmediatamente al 911 o vaya a la sala de emergencias.  Nmeros de bper  - Dr. Nehemiah Massed: [phone]  - Dra. Moye: [phone]  - Dra. Nicole Kindred: [phone]  En caso de inclemencias del [place], por favor llame a Johnsie Kindred principal al [phone] para una actualizacin sobre el [place] de cualquier retraso o cierre.  Consejos para la medicacin en dermatologa: Por favor, guarde las cajas en las que vienen los medicamentos de uso tpico para ayudarle a seguir las instrucciones sobre dnde y cmo usarlos. Las farmacias generalmente imprimen las instrucciones del medicamento slo en las cajas y no directamente en los tubos del [place].   Si su medicamento es muy caro, por favor, pngase en contacto con Zigmund Daniel llamando al [phone] y presione la opcin 4 o envenos un mensaje a travs de Pharmacist, community.  No podemos decirle cul ser su copago por los medicamentos por adelantado ya que esto es diferente dependiendo de la cobertura de su seguro. Sin embargo, es posible que podamos encontrar un medicamento sustituto a menor costo o llenar un formulario para que el seguro cubra el medicamento que se considera necesario.   Si se requiere una autorizacin previa para que su compaa de seguros cubra su medicamento, por favor permtanos de 1 a 2 das hbiles para completar este proceso.  Los precios de los medicamentos varan con frecuencia dependiendo del lugar de dnde se surte la receta y alguna farmacias pueden ofrecer precios ms baratos.  El sitio web www.goodrx.com tiene cupones para medicamentos de diferentes farmacias. Los precios aqu no tienen en cuenta lo que podra costar con la ayuda del seguro (puede ser ms barato con su seguro), pero el sitio web puede darle el precio si no utiliz ningn seguro.  - Puede imprimir el cupn correspondiente y llevarlo con su receta  a la farmacia.  - Tambin puede pasar por nuestra oficina durante el horario de atencin regular y recoger una tarjeta de cupones de GoodRx.  - Si necesita que su receta se enve electrnicamente a una farmacia diferente, informe a nuestra oficina a travs de MyChart de [place] [facility] o por telfono llamando al [phone] y presione la opcin 4.  

## 2021-08-10 NOTE — Progress Notes (Signed)
   Follow-Up Visit   Subjective  Natalie Monroe is a 71 y.o. female who presents for the following: Basal Cell Carcinoma (Right inf nasal ala, biopsy proven. EDC today. ) and Growth (Vaginal area, pt noticed on Thursday in the shower. Area was painful when it first came up. Drained Thursday night.).   The following portions of the chart were reviewed this encounter and updated as appropriate:       Review of Systems:  No other skin or systemic complaints except as noted in HPI or Assessment and Plan.  Objective  Well appearing patient in no apparent distress; mood and affect are within normal limits.  A focused examination was performed including face, vaginal area. Relevant physical exam findings are noted in the Assessment and Plan.  right inf nasal ala Pink biopsy site.  vulva Small white papule with erythema and edema at clitoral hood  Additional firm white nodules BL labia     Assessment & Plan  Basal cell carcinoma (BCC) of skin of nose right inf nasal ala  Destruction of lesion  Destruction method: electrodesiccation and curettage   Informed consent: discussed and consent obtained   Timeout:  patient name, date of birth, surgical site, and procedure verified Anesthesia: the lesion was anesthetized in a standard fashion   Anesthetic:  1% lidocaine w/ epinephrine 1-100,000 local infiltration Curettage performed in three different directions: Yes   Electrodesiccation performed over the curetted area: Yes   Final wound size (cm):  0.3 Hemostasis achieved with:  pressure, aluminum chloride and electrodesiccation Outcome: patient tolerated procedure well with no complications   Post-procedure details: wound care instructions given   Post-procedure details comment:  Ointment and applied.  BASAL CELL CARCINOMA, NODULAR AND INFILTRATIVE PATTERNS, BASE INVOLVED. Biopsy proven.  Inflamed epidermoid cyst of skin vulva  Multiple. Benign. Observation.  Procedure-  Clitoral hood- Extraction of cyst contents (including cyst wall) removed with opposing cotton swabs, followed by Mupirocin ointment.  Start mupirocin 2% ointment Apply QD to AA until healed dsp 22g 0Rf.    mupirocin ointment (BACTROBAN) 2 % - vulva Apply to affected area once daily until improved.  Actinic Damage - chronic, secondary to cumulative UV radiation exposure/sun exposure over time - diffuse scaly erythematous macules with underlying dyspigmentation - Recommend daily broad spectrum sunscreen SPF 30+ to sun-exposed areas, reapply every 2 hours as needed.  - Recommend staying in the shade or wearing long sleeves, sun glasses (UVA+UVB protection) and wide brim hats (4-inch brim around the entire circumference of the hat). - Call for new or changing lesions.   Return as scheduled with Dr Laurence Ferrari, for TBSE, Hx BCC.  IJamesetta Orleans, CMA, am acting as scribe for Brendolyn Patty, MD .  Documentation: I have reviewed the above documentation for accuracy and completeness, and I agree with the above.  Brendolyn Patty MD

## 2021-10-28 ENCOUNTER — Ambulatory Visit (INDEPENDENT_AMBULATORY_CARE_PROVIDER_SITE_OTHER): Payer: Medicare Other | Admitting: Dermatology

## 2021-10-28 DIAGNOSIS — L821 Other seborrheic keratosis: Secondary | ICD-10-CM

## 2021-10-28 DIAGNOSIS — D485 Neoplasm of uncertain behavior of skin: Secondary | ICD-10-CM | POA: Diagnosis not present

## 2021-10-28 DIAGNOSIS — D492 Neoplasm of unspecified behavior of bone, soft tissue, and skin: Secondary | ICD-10-CM | POA: Diagnosis not present

## 2021-10-28 DIAGNOSIS — L814 Other melanin hyperpigmentation: Secondary | ICD-10-CM

## 2021-10-28 DIAGNOSIS — B009 Herpesviral infection, unspecified: Secondary | ICD-10-CM | POA: Diagnosis not present

## 2021-10-28 DIAGNOSIS — D1801 Hemangioma of skin and subcutaneous tissue: Secondary | ICD-10-CM

## 2021-10-28 DIAGNOSIS — D239 Other benign neoplasm of skin, unspecified: Secondary | ICD-10-CM

## 2021-10-28 DIAGNOSIS — Z1283 Encounter for screening for malignant neoplasm of skin: Secondary | ICD-10-CM | POA: Diagnosis not present

## 2021-10-28 DIAGNOSIS — Z85828 Personal history of other malignant neoplasm of skin: Secondary | ICD-10-CM | POA: Diagnosis not present

## 2021-10-28 DIAGNOSIS — L578 Other skin changes due to chronic exposure to nonionizing radiation: Secondary | ICD-10-CM | POA: Diagnosis not present

## 2021-10-28 HISTORY — DX: Other benign neoplasm of skin, unspecified: D23.9

## 2021-10-28 NOTE — Patient Instructions (Addendum)
Wound Care Instructions  Cleanse wound gently with soap and water once a day then pat dry with clean gauze. Apply a thin coat of Petrolatum (petroleum jelly, "Vaseline") over the wound (unless you have an allergy to this). We recommend that you use a new, sterile tube of Vaseline. Do not pick or remove scabs. Do not remove the yellow or white "healing tissue" from the base of the wound.  Cover the wound with fresh, clean, nonstick gauze and secure with paper tape. You may use Band-Aids in place of gauze and tape if the wound is small enough, but would recommend trimming much of the tape off as there is often too much. Sometimes Band-Aids can irritate the skin.  You should call the office for your biopsy report after 1 week if you have not already been contacted.  If you experience any problems, such as abnormal amounts of bleeding, swelling, significant bruising, significant pain, or evidence of infection, please call the office immediately.  FOR ADULT SURGERY PATIENTS: If you need something for pain relief you may take 1 extra strength Tylenol (acetaminophen) AND 2 Ibuprofen (200mg each) together every 4 hours as needed for pain. (do not take these if you are allergic to them or if you have a reason you should not take them.) Typically, you may only need pain medication for 1 to 3 days.   Melanoma ABCDEs  Melanoma is the most dangerous type of skin cancer, and is the leading cause of death from skin disease.  You are more likely to develop melanoma if you: Have light-colored skin, light-colored eyes, or red or blond hair Spend a lot of time in the sun Tan regularly, either outdoors or in a tanning bed Have had blistering sunburns, especially during childhood Have a close family member who has had a melanoma Have atypical moles or large birthmarks  Early detection of melanoma is key since treatment is typically straightforward and cure rates are extremely high if we catch it early.   The  first sign of melanoma is often a change in a mole or a new dark spot.  The ABCDE system is a way of remembering the signs of melanoma.  A for asymmetry:  The two halves do not match. B for border:  The edges of the growth are irregular. C for color:  A mixture of colors are present instead of an even brown color. D for diameter:  Melanomas are usually (but not always) greater than 6mm - the size of a pencil eraser. E for evolution:  The spot keeps changing in size, shape, and color.  Please check your skin once per month between visits. You can use a small mirror in front and a large mirror behind you to keep an eye on the back side or your body.   If you see any new or changing lesions before your next follow-up, please call to schedule a visit.  Please continue daily skin protection including broad spectrum sunscreen SPF 30+ to sun-exposed areas, reapplying every 2 hours as needed when you're outdoors.    Due to recent changes in healthcare laws, you may see results of your pathology and/or laboratory studies on MyChart before the doctors have had a chance to review them. We understand that in some cases there may be results that are confusing or concerning to you. Please understand that not all results are received at the same time and often the doctors may need to interpret multiple results in order to provide you   with the best plan of care or course of treatment. Therefore, we ask that you please give us 2 business days to thoroughly review all your results before contacting the office for clarification. Should we see a critical lab result, you will be contacted sooner.   If You Need Anything After Your Visit  If you have any questions or concerns for your doctor, please call our main line at [phone] and press option 4 to reach your doctor's medical assistant. If no one answers, please leave a voicemail as directed and we will return your call as soon as possible. Messages left after 4  pm will be answered the following business day.   You may also send us a message via MyChart. We typically respond to MyChart messages within 1-2 business days.  For prescription refills, please ask your pharmacy to contact our office. Our fax number is [phone].  If you have an urgent issue when the clinic is closed that cannot wait until the next business day, you can page your doctor at the number below.    Please note that while we do our best to be available for urgent issues outside of office hours, we are not available 24/7.   If you have an urgent issue and are unable to reach us, you may choose to seek medical care at your doctor's office, retail clinic, urgent care center, or emergency room.  If you have a medical emergency, please immediately call 911 or go to the emergency department.  Pager Numbers  - Dr. Kowalski: [phone]  - Dr. Moye: [phone]  - Dr. Stewart: [phone]  In the event of inclement weather, please call our main line at [phone] for an update on the status of any delays or closures.  Dermatology Medication Tips: Please keep the boxes that topical medications come in in order to help keep track of the instructions about where and how to use these. Pharmacies typically print the medication instructions only on the boxes and not directly on the medication tubes.   If your medication is too expensive, please contact our office at [phone] option 4 or send us a message through MyChart.   We are unable to tell what your co-pay for medications will be in advance as this is different depending on your insurance coverage. However, we may be able to find a substitute medication at lower cost or fill out paperwork to get insurance to cover a needed medication.   If a prior authorization is required to get your medication covered by your insurance company, please allow us 1-2 business days to complete this process.  Drug prices often vary  depending on where the prescription is filled and some pharmacies may offer cheaper prices.  The website www.goodrx.com contains coupons for medications through different pharmacies. The prices here do not account for what the cost may be with help from insurance (it may be cheaper with your insurance), but the website can give you the price if you did not use any insurance.  - You can print the associated coupon and take it with your prescription to the pharmacy.  - You may also stop by our office during regular business hours and pick up a GoodRx coupon card.  - If you need your prescription sent electronically to a different pharmacy, notify our office through [place] MyChart or by phone at [phone] option 4.     Si Usted Necesita Algo Despus de Su Visita  Tambin puede enviarnos un mensaje a travs   travs de MyChart. Por lo general respondemos a los mensajes de MyChart en el transcurso de 1 a 2 das hbiles.  Para renovar recetas, por favor pida a su farmacia que se ponga en contacto con nuestra oficina. Harland Dingwall de fax es [place] [phone].  Si tiene un asunto urgente cuando la clnica est cerrada y que no puede esperar hasta el siguiente da hbil, puede llamar/localizar a su doctor(a) al nmero que aparece a continuacin.   Por favor, tenga en cuenta que aunque hacemos todo lo posible para estar disponibles para asuntos urgentes fuera del horario de [place], no estamos disponibles las 24 horas del da, los 7 das de la [place].   Si tiene un problema urgente y no puede comunicarse con nosotros, puede optar por buscar atencin mdica  en el consultorio de su doctor(a), en una clnica privada, en un centro de atencin urgente o en una sala de emergencias.  Si tiene Engineering geologist, por favor llame inmediatamente al 911 o vaya a la sala de emergencias.  Nmeros de bper  - Dr. Nehemiah Massed: [phone]  - Dra. Moye: [phone]  - Dra. Nicole Kindred: [phone]  En caso de  inclemencias del [place], por favor llame a Johnsie Kindred principal al [phone] para una actualizacin sobre el Jane de cualquier retraso o cierre.  Consejos para la medicacin en dermatologa: Por favor, guarde las cajas en las que vienen los medicamentos de uso tpico para ayudarle a seguir las instrucciones sobre dnde y cmo usarlos. Las farmacias generalmente imprimen las instrucciones del medicamento slo en las cajas y no directamente en los tubos del [place].   Si su medicamento es muy caro, por favor, pngase en contacto con Zigmund Daniel llamando al [phone] y presione la opcin 4 o envenos un mensaje a travs de Pharmacist, community.   No podemos decirle cul ser su copago por los medicamentos por adelantado ya que esto es diferente dependiendo de la cobertura de su seguro. Sin embargo, es posible que podamos encontrar un medicamento sustituto a Electrical engineer un formulario para que el seguro cubra el medicamento que se considera necesario.   Si se requiere una autorizacin previa para que su compaa de seguros [place] su medicamento, por favor permtanos de 1 a 2 das hbiles para completar este proceso.  Los precios de los medicamentos varan con frecuencia dependiendo del Environmental consultant de dnde se surte la receta y alguna farmacias pueden ofrecer precios ms baratos.  El sitio web www.goodrx.com tiene cupones para medicamentos de Airline pilot. Los precios aqu no tienen en cuenta lo que podra costar con la ayuda del seguro (puede ser ms barato con su seguro), pero el sitio web puede darle el precio si no utiliz Research scientist (physical sciences).  - Puede imprimir el cupn correspondiente y llevarlo con su receta a la farmacia.  - Tambin puede pasar por nuestra oficina durante el horario de atencin regular y Charity fundraiser una tarjeta de cupones de GoodRx.  - Si necesita que su receta se enve electrnicamente a una farmacia diferente, informe a nuestra oficina a travs de MyChart de [place] o  por telfono llamando al [phone] y presione la opcin 4.  Mohs - Dr. Manley Mason at [facility] Dr. Lacinda Axon at [facility] Dr. Winifred Olive at [facility], [place]

## 2021-10-28 NOTE — Progress Notes (Signed)
Follow-Up Visit   Subjective  Natalie Monroe is a 71 y.o. female who presents for the following: Annual Exam (The patient presents for Total-Body Skin Exam (TBSE) for skin cancer screening and mole check.  The patient has spots, moles and lesions to be evaluated, some may be new or changing and the patient has concerns that these could be cancer. Patient with hx of BCC. ), HSV (Patient taking acyclovir 400 mg twice daily as preventative, needs refills. ), and lichen sclerosus  (Patient currently using HC 2.5% ointment daily. Patient still c/o itching and cysts at labia.).  Patient does c/o brittle nails and very dry skin at hands and fingers.  The following portions of the chart were reviewed this encounter and updated as appropriate:   Tobacco  Allergies  Meds  Problems  Med Hx  Surg Hx  Fam Hx      Review of Systems:  No other skin or systemic complaints except as noted in HPI or Assessment and Plan.  Objective  Well appearing patient in no apparent distress; mood and affect are within normal limits.  A full examination was performed including vertex and frontal scalp, head, eyes, ears, nose, lips, neck, chest, axillae, abdomen, front of legs and arms, hands, feet, fingers, toes, fingernails, and toenails. All findings within normal limits unless otherwise noted below.  Right Lateral Breast 0.5 cm slightly irregular medium to dark brown thin papule     right inferior nasal ala extending to alar crease 0.3 cm white papule R/o recurrent BCC vs scar       Assessment & Plan  HSV infection Chronic condition with duration or expected duration over one year. Currently well-controlled.  Related Medications acyclovir (ZOVIRAX) 400 MG tablet Continue to take 1 tablet (400 mg total) by mouth 2 (two) times daily when needed for suppression  Neoplasm of uncertain behavior of skin Right Lateral Breast  Epidermal / dermal shaving  Lesion diameter (cm):  0.5 Informed  consent: discussed and consent obtained   Timeout: patient name, date of birth, surgical site, and procedure verified   Anesthesia: the lesion was anesthetized in a standard fashion   Anesthetic:  1% lidocaine w/ epinephrine 1-100,000 local infiltration Instrument used: flexible razor blade   Hemostasis achieved with: aluminum chloride   Outcome: patient tolerated procedure well   Post-procedure details: wound care instructions given   Additional details:  Mupirocin and a bandage applied  Specimen 1 - Surgical pathology Differential Diagnosis: r/o Atypia  Check Margins: No 0.5 cm slightly irregular medium to dark brown thin papule  Neoplasm of skin right inferior nasal ala extending to alar crease  Skin / nail biopsy Type of biopsy: punch   Informed consent: discussed and consent obtained   Timeout: patient name, date of birth, surgical site, and procedure verified   Procedure prep:  Patient was prepped and draped in usual sterile fashion Prep type:  Isopropyl alcohol Anesthesia: the lesion was anesthetized in a standard fashion   Anesthetic:  1% lidocaine w/ epinephrine 1-100,000 buffered w/ 8.4% NaHCO3 Punch size:  3.5 mm Suture size:  5-0 Suture type: nylon   Suture removal (days):  7 Hemostasis achieved with: suture, aluminum chloride and Gelfoam   Outcome: patient tolerated procedure well   Post-procedure details: wound care instructions given   Additional details:  Petrolatum and a pressure dressing were applied  Specimen 2 - Surgical pathology Differential Diagnosis: R/o recurrent BCC vs scar  Check Margins: No 0.3 cm white papule  History of Basal Cell Carcinoma of the Skin - No evidence of recurrence today at right inferior nasal ala with dimpling of nearby skin, question related to pulling from scar tissue vs residual infiltrative BCC - Recommend regular full body skin exams - Recommend daily broad spectrum sunscreen SPF 30+ to sun-exposed areas, reapply every  2 hours as needed.  - Call if any new or changing lesions are noted between office visits - Discussed possibility of recurrence, would refer for Mohs - Will contact Dr. Manley Mason  Lentigines - Scattered tan macules - Due to sun exposure - Benign-appearing, observe - Recommend daily broad spectrum sunscreen SPF 30+ to sun-exposed areas, reapply every 2 hours as needed. - Call for any changes  Seborrheic Keratoses - Stuck-on, waxy, tan-brown papules and/or plaques  - Benign-appearing - Discussed benign etiology and prognosis. - Observe - Call for any changes  Melanocytic Nevi - Tan-brown and/or pink-flesh-colored symmetric macules and papules - Benign appearing on exam today - Observation - Call clinic for new or changing moles - Recommend daily use of broad spectrum spf 30+ sunscreen to sun-exposed areas.   Hemangiomas - Red papules - Discussed benign nature - Observe - Call for any changes  Actinic Damage - Chronic condition, secondary to cumulative UV/sun exposure - diffuse scaly erythematous macules with underlying dyspigmentation - Recommend daily broad spectrum sunscreen SPF 30+ to sun-exposed areas, reapply every 2 hours as needed.  - Staying in the shade or wearing long sleeves, sun glasses (UVA+UVB protection) and wide brim hats (4-inch brim around the entire circumference of the hat) are also recommended for sun protection.  - Call for new or changing lesions.  Skin cancer screening performed today.  Return for Suture Removal and examine back of body, as scheduled.  [name], RMA, am acting as scribe for Forest Gleason, MD .  Documentation: I have reviewed the above documentation for accuracy and completeness, and I agree with the above.  Forest Gleason, MD

## 2021-10-29 ENCOUNTER — Encounter: Payer: Self-pay | Admitting: Dermatology

## 2021-11-02 ENCOUNTER — Encounter: Payer: Self-pay | Admitting: Dermatology

## 2021-11-02 ENCOUNTER — Telehealth: Payer: Self-pay

## 2021-11-02 ENCOUNTER — Telehealth: Payer: Self-pay | Admitting: Dermatology

## 2021-11-02 NOTE — Telephone Encounter (Signed)
-----   Message from Alfonso Patten, MD sent at 11/02/2021 12:08 PM EDT ----- 1. 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 On the spectrum from normal mole to melanoma skin cancer, this is in between the two. - We need to recheck this area sometime in the next 6 months to be sure there is no evidence of the atypical mole coming back. If there is any color coming back, we would recommend repeating the biopsy to be sure the cells look normal.  - People who have a history of atypical moles do have a slightly increased risk of developing melanoma somewhere on the body, so a yearly full body skin exam by a dermatologist is recommended.  - Monthly self skin checks and daily sun protection are also recommended.  - Please call if you notice a dark spot coming back where this biopsy was taken.  - Please also call if you notice any new or changing spots anywhere else on the body before your follow-up visit.    2. 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 Thank you!

## 2021-11-02 NOTE — Telephone Encounter (Signed)
Patient advised bx dysplastic nevus with moderate atypia/ Lurlean Horns., RMA

## 2021-11-02 NOTE — Telephone Encounter (Signed)
Can you please call Natalie Monroe and ask them to section through and look for the scar on the specimen for the right ala? I biopsied around a previous biopsy and ED&C site. Would like to confirm no residual BCC anywhere. Thank you!

## 2021-11-03 NOTE — Telephone Encounter (Signed)
Called [facility], spoke with Dr. Lincoln Brigham assistant, states they will section through and look for scar and confirm no residual bcc anywhere.

## 2021-11-04 ENCOUNTER — Telehealth: Payer: Self-pay

## 2021-11-04 ENCOUNTER — Ambulatory Visit (INDEPENDENT_AMBULATORY_CARE_PROVIDER_SITE_OTHER): Payer: Medicare Other | Admitting: Dermatology

## 2021-11-04 DIAGNOSIS — B009 Herpesviral infection, unspecified: Secondary | ICD-10-CM

## 2021-11-04 DIAGNOSIS — L814 Other melanin hyperpigmentation: Secondary | ICD-10-CM | POA: Diagnosis not present

## 2021-11-04 DIAGNOSIS — R21 Rash and other nonspecific skin eruption: Secondary | ICD-10-CM

## 2021-11-04 DIAGNOSIS — D229 Melanocytic nevi, unspecified: Secondary | ICD-10-CM

## 2021-11-04 DIAGNOSIS — D485 Neoplasm of uncertain behavior of skin: Secondary | ICD-10-CM

## 2021-11-04 DIAGNOSIS — L900 Lichen sclerosus et atrophicus: Secondary | ICD-10-CM

## 2021-11-04 DIAGNOSIS — L821 Other seborrheic keratosis: Secondary | ICD-10-CM

## 2021-11-04 DIAGNOSIS — D2271 Melanocytic nevi of right lower limb, including hip: Secondary | ICD-10-CM

## 2021-11-04 DIAGNOSIS — L578 Other skin changes due to chronic exposure to nonionizing radiation: Secondary | ICD-10-CM

## 2021-11-04 MED ORDER — ACYCLOVIR 400 MG PO TABS: 400.0000 mg | ORAL_TABLET | Freq: Two times a day (BID) | ORAL | 3 refills | Status: AC

## 2021-11-04 MED ORDER — KETOCONAZOLE 2 % EX CREA: 1.0000 | TOPICAL_CREAM | Freq: Two times a day (BID) | CUTANEOUS | 3 refills | Status: AC

## 2021-11-04 NOTE — Patient Instructions (Addendum)
Wound Care Instructions  Cleanse wound gently with soap and water once a day then pat dry with clean gauze. Apply a thin coat of Petrolatum (petroleum jelly, "Vaseline") over the wound (unless you have an allergy to this). We recommend that you use a new, sterile tube of Vaseline. Do not pick or remove scabs. Do not remove the yellow or white "healing tissue" from the base of the wound.  Cover the wound with fresh, clean, nonstick gauze and secure with paper tape. You may use Band-Aids in place of gauze and tape if the wound is small enough, but would recommend trimming much of the tape off as there is often too much. Sometimes Band-Aids can irritate the skin.  You should call the office for your biopsy report after 1 week if you have not already been contacted.  If you experience any problems, such as abnormal amounts of bleeding, swelling, significant bruising, significant pain, or evidence of infection, please call the office immediately.  FOR ADULT SURGERY PATIENTS: If you need something for pain relief you may take 1 extra strength Tylenol (acetaminophen) AND 2 Ibuprofen ('200mg'$  each) together every 4 hours as needed for pain. (do not take these if you are allergic to them or if you have a reason you should not take them.) Typically, you may only need pain medication for 1 to 3 days.   Discontinue HC 2.5% ointment Start ketoconazole 2% cream twice daily. Follow with a zinc barrier cream or plain Vaseline a few times daily.   Due to recent changes in healthcare laws, you may see results of your pathology and/or laboratory studies on MyChart before the doctors have had a chance to review them. We understand that in some cases there may be results that are confusing or concerning to you. Please understand that not all results are received at the same time and often the doctors may need to interpret multiple results in order to provide you with the best plan of care or course of treatment.  Therefore, we ask that you please give [place] 2 business days to thoroughly review all your results before contacting the office for clarification. Should we see a critical lab result, you will be contacted sooner.   If You Need Anything After Your Visit  If you have any questions or concerns for your doctor, please call our main line at [phone] and press option 4 to reach your doctor's medical assistant. If no one answers, please leave a voicemail as directed and we will return your call as soon as possible. Messages left after 4 pm will be answered the following business day.   You may also send [place] a message via [place]. We typically respond to MyChart messages within 1-2 business days.  For prescription refills, please ask your pharmacy to contact our office. Our fax number is [phone].  If you have an urgent issue when the clinic is closed that cannot wait until the next business day, you can page your doctor at the number below.    Please note that while we do our best to be available for urgent issues outside of office hours, we are not available 24/7.   If you have an urgent issue and are unable to reach [place], you may choose to seek medical care at your doctor's office, retail clinic, urgent care center, or emergency room.  If you have a medical emergency, please immediately call 911 or go to the emergency department.  Pager Numbers  - Dr. Nehemiah Massed: [phone]  -  Dr. Laurence Ferrari: [phone]  - Dr. Nicole Kindred: [phone]  In the event of inclement weather, please call our main line at [phone] for an update on the status of any delays or closures.  Dermatology Medication Tips: Please keep the boxes that topical medications come in in order to help keep track of the instructions about where and how to use these. Pharmacies typically print the medication instructions only on the boxes and not directly on the medication tubes.   If your medication is too expensive, please  contact our office at [phone] option 4 or send [place] a message through [place].   We are unable to tell what your co-pay for medications will be in advance as this is different depending on your insurance coverage. However, we may be able to find a substitute medication at lower cost or fill out paperwork to get insurance to cover a needed medication.   If a prior authorization is required to get your medication covered by your insurance company, please allow [place] 1-2 business days to complete this process.  Drug prices often vary depending on where the prescription is filled and some pharmacies may offer cheaper prices.  The website www.goodrx.com contains coupons for medications through different pharmacies. The prices here do not account for what the cost may be with help from insurance (it may be cheaper with your insurance), but the website can give you the price if you did not use any insurance.  - You can print the associated coupon and take it with your prescription to the pharmacy.  - You may also stop by our office during regular business hours and pick up a GoodRx coupon card.  - If you need your prescription sent electronically to a different pharmacy, notify our office through [facility] or by phone at [phone] option 4.     Si Usted Necesita Algo Despus de Su Visita  Tambin puede enviarnos un mensaje a travs de Pharmacist, community. Por lo general respondemos a los mensajes de MyChart en el transcurso de 1 a 2 das hbiles.  Para renovar recetas, por favor pida a su farmacia que se ponga en contacto con nuestra oficina. Harland Dingwall de fax es [place] [phone].  Si tiene un asunto urgente cuando la clnica est cerrada y que no puede esperar hasta el siguiente da hbil, puede llamar/localizar a su doctor(a) al nmero que aparece a continuacin.   Por favor, tenga en cuenta que aunque hacemos todo lo posible para estar disponibles para asuntos urgentes fuera del horario de  [place], no estamos disponibles las 24 horas del da, los 7 das de la [place].   Si tiene un problema urgente y no puede comunicarse con nosotros, puede optar por buscar atencin mdica  en el consultorio de su doctor(a), en una clnica privada, en un centro de atencin urgente o en una sala de emergencias.  Si tiene Engineering geologist, por favor llame inmediatamente al 911 o vaya a la sala de emergencias.  Nmeros de bper  - Dr. Nehemiah Massed: [phone]  - Dra. Moye: [phone]  - Dra. Nicole Kindred: [phone]  En caso de inclemencias del [place], por favor llame a Johnsie Kindred principal al [phone] para una actualizacin sobre el [place] de cualquier retraso o cierre.  Consejos para la medicacin en dermatologa: Por favor, guarde las cajas en las que vienen los medicamentos de uso tpico para ayudarle a seguir las instrucciones sobre dnde y cmo usarlos. Las farmacias generalmente imprimen las instrucciones del medicamento slo en las cajas y  no directamente en los tubos del medicamento.   Si su medicamento es muy caro, por favor, pngase en contacto con Zigmund Daniel llamando al [phone] y presione la opcin 4 o envenos un mensaje a travs de Pharmacist, community.   No podemos decirle cul ser su copago por los medicamentos por adelantado ya que esto es diferente dependiendo de la cobertura de su seguro. Sin embargo, es posible que podamos encontrar un medicamento sustituto a Electrical engineer un formulario para que el seguro cubra el medicamento que se considera necesario.   Si se requiere una autorizacin previa para que su compaa de seguros [place] su medicamento, por favor permtanos de 1 a 2 das hbiles para completar este proceso.  Los precios de los medicamentos varan con frecuencia dependiendo del Environmental consultant de dnde se surte la receta y alguna farmacias pueden ofrecer precios ms baratos.  El sitio web www.goodrx.com tiene cupones para medicamentos de Airline pilot. Los  precios aqu no tienen en cuenta lo que podra costar con la ayuda del seguro (puede ser ms barato con su seguro), pero el sitio web puede darle el precio si no utiliz Research scientist (physical sciences).  - Puede imprimir el cupn correspondiente y llevarlo con su receta a la farmacia.  - Tambin puede pasar por nuestra oficina durante el horario de atencin regular y Charity fundraiser una tarjeta de cupones de GoodRx.  - Si necesita que su receta se enve electrnicamente a una farmacia diferente, informe a nuestra oficina a travs de MyChart de [place] o por telfono llamando al [phone] y presione la opcin 4.

## 2021-11-04 NOTE — Telephone Encounter (Signed)
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 JP

## 2021-11-04 NOTE — Progress Notes (Signed)
Follow-Up Visit   Subjective  Natalie Monroe is a 71 y.o. female who presents for the following: Follow-up (Patient here for suture removal, LS et A follow up. ).  Patient needs refills for acyclovir.   The following portions of the chart were reviewed this encounter and updated as appropriate:   Tobacco  Allergies  Meds  Problems  Med Hx  Surg Hx  Fam Hx      Review of Systems:  No other skin or systemic complaints except as noted in HPI or Assessment and Plan.  Objective  Well appearing patient in no apparent distress; mood and affect are within normal limits.  A full examination was performed including scalp, head, eyes, ears, nose, lips, neck, chest, axillae, abdomen, back, buttocks, bilateral upper extremities, bilateral lower extremities, hands, feet, fingers, toes, fingernails, and toenails. All findings within normal limits unless otherwise noted below.  right lateral foot 0.15 cm brown macule  R/o Atypia     Lips Clear   Pubic Skin with very faint/minimal erythema    Assessment & Plan  Neoplasm of uncertain behavior of skin right lateral foot  Epidermal / dermal shaving  Lesion diameter (cm):  0.2 Informed consent: discussed and consent obtained   Timeout: patient name, date of birth, surgical site, and procedure verified   Anesthesia: the lesion was anesthetized in a standard fashion   Anesthetic:  1% lidocaine w/ epinephrine 1-100,000 local infiltration Instrument used: flexible razor blade   Hemostasis achieved with: aluminum chloride   Outcome: patient tolerated procedure well   Post-procedure details: wound care instructions given   Additional details:  Mupirocin and a bandage applied  Specimen 1 - Surgical pathology Differential Diagnosis: R/o Atypia  Check Margins: No 0.15 cm brown macule    Herpes simplex Lips  H/o  Continue acyclovir 400 mg twice daily #180 3RF  HSV infection  Related Medications acyclovir (ZOVIRAX)  400 MG tablet Take 1 tablet (400 mg total) by mouth 2 (two) times daily.  Rash and other nonspecific skin eruption Pubic  History of lichen sclerosus et atrophicus; has been using hydrocortisone ointment daily. Currently well-controlled  D/c HC ointment  Start ketoconazole 2% cream twice daily. Start a zinc barrier cream or plain Vaseline a few times daily.  ketoconazole (NIZORAL) 2 % cream - Pubic Apply 1 Application topically 2 (two) times daily.   Lentigines - Scattered tan macules - Due to sun exposure - Benign-appearing, observe - Recommend daily broad spectrum sunscreen SPF 30+ to sun-exposed areas, reapply every 2 hours as needed. - Call for any changes  Seborrheic Keratoses - Stuck-on, waxy, tan-brown papules and/or plaques  - Benign-appearing - Discussed benign etiology and prognosis. - Observe - Call for any changes  Melanocytic Nevi - Tan-brown and/or pink-flesh-colored symmetric macules and papules - Benign appearing on exam today - Observation - Call clinic for new or changing moles - Recommend daily use of broad spectrum spf 30+ sunscreen to sun-exposed areas.   Hemangiomas - Red papules - Discussed benign nature - Observe - Call for any changes  Actinic Damage - Chronic condition, secondary to cumulative UV/sun exposure - diffuse scaly erythematous macules with underlying dyspigmentation - Recommend daily broad spectrum sunscreen SPF 30+ to sun-exposed areas, reapply every 2 hours as needed.  - Staying in the shade or wearing long sleeves, sun glasses (UVA+UVB protection) and wide brim hats (4-inch brim around the entire circumference of the hat) are also recommended for sun protection.  - Call for new or  changing lesions.  Skin cancer screening performed today.  Encounter for Removal of Sutures - Incision site at the right inferior nasal ala extending to alar crease is clean, dry and intact - Wound cleansed, sutures removed, wound cleansed and  steri strips applied.  - Discussed pathology results showing hidrocystoma  - Patient advised to keep steri-strips dry until they fall off. - Scars remodel for a full year. - Once steri-strips fall off, patient can apply over-the-counter silicone scar cream each night to help with scar remodeling if desired. - Patient advised to call with any concerns or if they notice any new or changing lesions.  Return in about 6 months (around 05/06/2022) for TBSE, LSetA.  [name], RMA, am acting as scribe for Forest Gleason, MD .  Documentation: I have reviewed the above documentation for accuracy and completeness, and I agree with the above.  Forest Gleason, MD

## 2021-11-04 NOTE — Telephone Encounter (Signed)
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 No additional treatment needed.  MAs please call. Thank you!

## 2021-11-05 ENCOUNTER — Encounter: Payer: Self-pay | Admitting: Dermatology

## 2021-11-11 ENCOUNTER — Telehealth: Payer: Self-pay

## 2021-11-11 NOTE — Telephone Encounter (Signed)
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 On the spectrum from normal mole to melanoma skin cancer, this is in between the two. - We need to recheck this area sometime in the next 6 months to be sure there is no evidence of the atypical mole coming back. If there is any color coming back, we would recommend repeating the biopsy to be sure the cells look normal.  - People who have a history of atypical moles do have a slightly increased risk of developing melanoma somewhere on the body, so a yearly full body skin exam by a dermatologist is recommended.  - Monthly self skin checks and daily sun protection are also recommended.  - Please call if you notice a dark spot coming back where this biopsy was taken.  - Please also call if you notice any new or changing spots anywhere else on the body before your follow-up visit.    MAs please call. Thank you!

## 2021-11-11 NOTE — Telephone Encounter (Signed)
Discussed pathology results with patient. [place] for recheck.

## 2022-05-05 ENCOUNTER — Ambulatory Visit (INDEPENDENT_AMBULATORY_CARE_PROVIDER_SITE_OTHER): Payer: Medicare Other | Admitting: Dermatology

## 2022-05-05 VITALS — BP 160/80

## 2022-05-05 DIAGNOSIS — Z1283 Encounter for screening for malignant neoplasm of skin: Secondary | ICD-10-CM | POA: Diagnosis not present

## 2022-05-05 DIAGNOSIS — Z86018 Personal history of other benign neoplasm: Secondary | ICD-10-CM

## 2022-05-05 DIAGNOSIS — D229 Melanocytic nevi, unspecified: Secondary | ICD-10-CM

## 2022-05-05 DIAGNOSIS — L821 Other seborrheic keratosis: Secondary | ICD-10-CM | POA: Diagnosis not present

## 2022-05-05 DIAGNOSIS — L578 Other skin changes due to chronic exposure to nonionizing radiation: Secondary | ICD-10-CM

## 2022-05-05 DIAGNOSIS — L814 Other melanin hyperpigmentation: Secondary | ICD-10-CM | POA: Diagnosis not present

## 2022-05-05 DIAGNOSIS — R202 Paresthesia of skin: Secondary | ICD-10-CM

## 2022-05-05 DIAGNOSIS — L249 Irritant contact dermatitis, unspecified cause: Secondary | ICD-10-CM

## 2022-05-05 DIAGNOSIS — Z85828 Personal history of other malignant neoplasm of skin: Secondary | ICD-10-CM

## 2022-05-05 MED ORDER — TACROLIMUS 0.1 % EX OINT: TOPICAL_OINTMENT | Freq: Two times a day (BID) | CUTANEOUS | 2 refills | Status: DC

## 2022-05-05 NOTE — Progress Notes (Signed)
Follow-Up Visit   Subjective  Natalie Monroe is a 72 y.o. female who presents for the following: Skin Cancer Screening and Full Body Skin Exam hx of BCC, Dysplastic nevus  Also with itching pubic and rectal areas for over a year, has been using triamcinolone cream. She has tried zinc paste and HC in past didn't help. Also with itch at back.  The patient presents for Total-Body Skin Exam (TBSE) for skin cancer screening and mole check. The patient has spots, moles and lesions to be evaluated, some may be new or changing and the patient has concerns that these could be cancer.  The following portions of the chart were reviewed this encounter and updated as appropriate: medications, allergies, medical history  Review of Systems:  No other skin or systemic complaints except as noted in HPI or Assessment and Plan.  Objective  Well appearing patient in no apparent distress; mood and affect are within normal limits.  A full examination was performed including scalp, head, eyes, ears, nose, lips, neck, chest, axillae, abdomen, back, buttocks, bilateral upper extremities, bilateral lower extremities, hands, feet, fingers, toes, fingernails, and toenails. All findings within normal limits unless otherwise noted below.   Relevant physical exam findings are noted in the Assessment and Plan.    Assessment & Plan   LENTIGINES, SEBORRHEIC KERATOSES, HEMANGIOMAS - Benign normal skin lesions - Benign-appearing - Call for any changes  MELANOCYTIC NEVI - Tan-brown and/or pink-flesh-colored symmetric macules and papules - Benign appearing on exam today - Observation - Call clinic for new or changing moles - Recommend daily use of broad spectrum spf 30+ sunscreen to sun-exposed areas.   ACTINIC DAMAGE - Chronic condition, secondary to cumulative UV/sun exposure - diffuse scaly erythematous macules with underlying dyspigmentation - Recommend daily broad spectrum sunscreen SPF 30+ to  sun-exposed areas, reapply every 2 hours as needed.  - Staying in the shade or wearing long sleeves, sun glasses (UVA+UVB protection) and wide brim hats (4-inch brim around the entire circumference of the hat) are also recommended for sun protection.  - Call for new or changing lesions.  SKIN CANCER SCREENING PERFORMED TODAY.   HISTORY OF DYSPLASTIC NEVUS No evidence of recurrence today Recommend regular full body skin exams Recommend daily broad spectrum sunscreen SPF 30+ to sun-exposed areas, reapply every 2 hours as needed.  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 Condition is symptomatic/ bothersome to patient. Not currently at goal.  Treatment Plan: D/c TMC 0.1% cr Start Tacrolimus 0.1% oint bid Start Zinc paste combined with Aquaphor prn  NOTALGIA PARESTHETICA Exam: pelvic area clear, low back clear  Treatment: OTC treatments which can help with itch include numbing creams like pramoxine or lidocaine which temporarily reduce itch or Capsaicin-containing creams which cause a burning sensation but which sometimes over time will reset the nerves to stop producing itch.  If you choose to use Capsaicin cream, it is recommended to use it 5 times daily for 1 week followed by 3 times daily for 3-6 weeks. You may have to continue using it long-term.  If not doing well with OTC options, could consider [facility] compounded prescription anti-itch cream with Amitriptyline 5% / Lidocaine 5% / Pramoxine 1% or Amitriptyline 5% /  Gabapentin 10% / Lidocaine 5% Cream or other prescription cream or pill options.     HISTORY OF BASAL CELL CARCINOMA OF THE SKIN - No evidence of recurrence today - Recommend  regular full body skin exams - Recommend daily broad spectrum sunscreen SPF 30+ to sun-exposed areas, reapply every 2 hours as needed.  - Call if any new or changing lesions are noted between office visits  - R inf nasal ala  Return in about 6 months (around 11/04/2022) for TBSE, Hx of BCC, Hx of Dysplastic nevi.  I, Ardis Rowan, RMA, am acting as scribe for Darden Dates, MD .   Documentation: I have reviewed the above documentation for accuracy and completeness, and I agree with the above.  Darden Dates, MD

## 2022-05-05 NOTE — Patient Instructions (Addendum)
Recommend OTC Gold Bond Rapid Relief Anti-Itch cream (pramoxine + menthol), CeraVe Anti-itch cream or lotion (pramoxine), Sarna lotion (Original- menthol + camphor or Sensitive- pramoxine) or Eucerin 12 hour Itch Relief lotion (menthol) up to 3 times per day to areas on body that are itchy.   For Perirectal Start Tacrolimus 0.1% oint bid Start Zinc paste combined with aquaphor prn   Due to recent changes in healthcare laws, you may see results of your pathology and/or laboratory studies on MyChart before the doctors have had a chance to review them. We understand that in some cases there may be results that are confusing or concerning to you. Please understand that not all results are received at the same time and often the doctors may need to interpret multiple results in order to provide you with the best plan of care or course of treatment. Therefore, we ask that you please give [place] 2 business days to thoroughly review all your results before contacting the office for clarification. Should we see a critical lab result, you will be contacted sooner.   If You Need Anything After Your Visit  If you have any questions or concerns for your doctor, please call our main line at [phone] and press option 4 to reach your doctor's medical assistant. If no one answers, please leave a voicemail as directed and we will return your call as soon as possible. Messages left after 4 pm will be answered the following business day.   You may also send [place] a message via MyChart. We typically respond to MyChart messages within 1-2 business days.  For prescription refills, please ask your pharmacy to contact our office. Our fax number is [phone].  If you have an urgent issue when the clinic is closed that cannot wait until the next business day, you can page your doctor at the number below.    Please note that while we do our best to be available for urgent issues outside of office hours, we are not available  24/7.   If you have an urgent issue and are unable to reach [place], you may choose to seek medical care at your doctor's office, retail clinic, urgent care center, or emergency room.  If you have a medical emergency, please immediately call 911 or go to the emergency department.  Pager Numbers  - Dr. Gwen Pounds: [phone]  - Dr. Neale Burly: [phone]  - Dr. Roseanne Reno: [phone]  In the event of inclement weather, please call our main line at [phone] for an update on the status of any delays or closures.  Dermatology Medication Tips: Please keep the boxes that topical medications come in in order to help keep track of the instructions about where and how to use these. Pharmacies typically print the medication instructions only on the boxes and not directly on the medication tubes.   If your medication is too expensive, please contact our office at [phone] option 4 or send [place] a message through MyChart.   We are unable to tell what your co-pay for medications will be in advance as this is different depending on your insurance coverage. However, we may be able to find a substitute medication at lower cost or fill out paperwork to get insurance to cover a needed medication.   If a prior authorization is required to get your medication covered by your insurance company, please allow [place] 1-2 business days to complete this process.  Drug prices often vary depending on where the prescription is filled and some pharmacies  may offer cheaper prices.  The website www.goodrx.com contains coupons for medications through different pharmacies. The prices here do not account for what the cost may be with help from insurance (it may be cheaper with your insurance), but the website can give you the price if you did not use any insurance.  - You can print the associated coupon and take it with your prescription to the pharmacy.  - You may also stop by our office during regular business hours and pick up  a GoodRx coupon card.  - If you need your prescription sent electronically to a different pharmacy, notify our office through [facility] or by phone at [phone] option 4.     Si Usted Necesita Algo Despus de Su Visita  Tambin puede enviarnos un mensaje a travs de Pharmacist, community. Por lo general respondemos a los mensajes de MyChart en el transcurso de 1 a 2 das hbiles.  Para renovar recetas, por favor pida a su farmacia que se ponga en contacto con nuestra oficina. Harland Dingwall de fax es [place] [phone].  Si tiene un asunto urgente cuando la clnica est cerrada y que no puede esperar hasta el siguiente da hbil, puede llamar/localizar a su doctor(a) al nmero que aparece a continuacin.   Por favor, tenga en cuenta que aunque hacemos todo lo posible para estar disponibles para asuntos urgentes fuera del horario de [place], no estamos disponibles las 24 horas del da, los 7 das de la [place].   Si tiene un problema urgente y no puede comunicarse con nosotros, puede optar por buscar atencin mdica  en el consultorio de su doctor(a), en una clnica privada, en un centro de atencin urgente o en una sala de emergencias.  Si tiene Engineering geologist, por favor llame inmediatamente al 911 o vaya a la sala de emergencias.  Nmeros de bper  - Dr. Nehemiah Massed: [phone]  - Dra. Moye: [phone]  - Dra. Nicole Kindred: [phone]  En caso de inclemencias del [place], por favor llame a Johnsie Kindred principal al [phone] para una actualizacin sobre el [place] de cualquier retraso o cierre.  Consejos para la medicacin en dermatologa: Por favor, guarde las cajas en las que vienen los medicamentos de uso tpico para ayudarle a seguir las instrucciones sobre dnde y cmo usarlos. Las farmacias generalmente imprimen las instrucciones del medicamento slo en las cajas y no directamente en los tubos del [place].   Si su medicamento es muy caro, por favor, pngase en contacto  con Zigmund Daniel llamando al [phone] y presione la opcin 4 o envenos un mensaje a travs de Pharmacist, community.   No podemos decirle cul ser su copago por los medicamentos por adelantado ya que esto es diferente dependiendo de la cobertura de su seguro. Sin embargo, es posible que podamos encontrar un medicamento sustituto a Electrical engineer un formulario para que el seguro cubra el medicamento que se considera necesario.   Si se requiere una autorizacin previa para que su compaa de seguros [place] su medicamento, por favor permtanos de 1 a 2 das hbiles para completar este proceso.  Los precios de los medicamentos varan con frecuencia dependiendo del Environmental consultant de dnde se surte la receta y alguna farmacias pueden ofrecer precios ms baratos.  El sitio web www.goodrx.com tiene cupones para medicamentos de Airline pilot. Los precios aqu no tienen en cuenta lo que podra costar con la ayuda del seguro (puede ser ms barato con su seguro), pero el sitio web puede darle el precio si no  utiliz ningn seguro.  - Puede imprimir el cupn correspondiente y llevarlo con su receta a la farmacia.  - Tambin puede pasar por nuestra oficina durante el horario de atencin regular y recoger una tarjeta de cupones de GoodRx.  - Si necesita que su receta se enve electrnicamente a una farmacia diferente, informe a nuestra oficina a travs de MyChart de [place] o por telfono llamando al [phone] y presione la opcin 4.  

## 2022-06-21 ENCOUNTER — Other Ambulatory Visit: Payer: Self-pay | Admitting: Family Medicine

## 2022-06-21 DIAGNOSIS — Z1231 Encounter for screening mammogram for malignant neoplasm of breast: Secondary | ICD-10-CM

## 2022-08-22 ENCOUNTER — Ambulatory Visit
Admission: RE | Admit: 2022-08-22 | Discharge: 2022-08-22 | Disposition: A | Discharge status: Home / Self Care | Payer: Medicare Other | Source: Ambulatory Visit | Attending: Family Medicine | Admitting: Family Medicine

## 2022-08-22 DIAGNOSIS — Z1231 Encounter for screening mammogram for malignant neoplasm of breast: Secondary | ICD-10-CM | POA: Diagnosis present

## 2022-11-08 ENCOUNTER — Ambulatory Visit (INDEPENDENT_AMBULATORY_CARE_PROVIDER_SITE_OTHER): Payer: Medicare Other | Admitting: Dermatology

## 2022-11-08 DIAGNOSIS — B001 Herpesviral vesicular dermatitis: Secondary | ICD-10-CM

## 2022-11-08 DIAGNOSIS — D2271 Melanocytic nevi of right lower limb, including hip: Secondary | ICD-10-CM

## 2022-11-08 DIAGNOSIS — Z86018 Personal history of other benign neoplasm: Secondary | ICD-10-CM

## 2022-11-08 DIAGNOSIS — Z1283 Encounter for screening for malignant neoplasm of skin: Secondary | ICD-10-CM

## 2022-11-08 DIAGNOSIS — L578 Other skin changes due to chronic exposure to nonionizing radiation: Secondary | ICD-10-CM | POA: Diagnosis not present

## 2022-11-08 DIAGNOSIS — D229 Melanocytic nevi, unspecified: Secondary | ICD-10-CM

## 2022-11-08 DIAGNOSIS — L814 Other melanin hyperpigmentation: Secondary | ICD-10-CM

## 2022-11-08 DIAGNOSIS — W908XXA Exposure to other nonionizing radiation, initial encounter: Secondary | ICD-10-CM | POA: Diagnosis not present

## 2022-11-08 DIAGNOSIS — Z85828 Personal history of other malignant neoplasm of skin: Secondary | ICD-10-CM

## 2022-11-08 DIAGNOSIS — D1801 Hemangioma of skin and subcutaneous tissue: Secondary | ICD-10-CM

## 2022-11-08 DIAGNOSIS — L249 Irritant contact dermatitis, unspecified cause: Secondary | ICD-10-CM | POA: Diagnosis not present

## 2022-11-08 DIAGNOSIS — B009 Herpesviral infection, unspecified: Secondary | ICD-10-CM

## 2022-11-08 DIAGNOSIS — D225 Melanocytic nevi of trunk: Secondary | ICD-10-CM

## 2022-11-08 DIAGNOSIS — L821 Other seborrheic keratosis: Secondary | ICD-10-CM

## 2022-11-08 MED ORDER — HYDROCORT-PRAMOXINE (PERIANAL) 2.5-1 % EX CREA: TOPICAL_CREAM | CUTANEOUS | 5 refills | Status: DC

## 2022-11-08 NOTE — Patient Instructions (Addendum)
For Herpes Flare  Hold acyclovir 400 mg daily use. If flared can take 5 tabs by mouth at first onset of symptoms and then after 12 hours take another 5 tabs.    For dermatitis at anal area  Can continue zinc paste combined with aquaphor at affected itchy areas  Start analpram rectal cream - apply topically 2 to 3 times daily as needed for itch when flared    Melanoma ABCDEs  Melanoma is the most dangerous type of skin cancer, and is the leading cause of death from skin disease.  You are more likely to develop melanoma if you: Have light-colored skin, light-colored eyes, or red or blond hair Spend a lot of time in the sun Tan regularly, either outdoors or in a tanning bed Have had blistering sunburns, especially during childhood Have a close family member who has had a melanoma Have atypical moles or large birthmarks  Early detection of melanoma is key since treatment is typically straightforward and cure rates are extremely high if we catch it early.   The first sign of melanoma is often a change in a mole or a new dark spot.  The ABCDE system is a way of remembering the signs of melanoma.  A for asymmetry:  The two halves do not match. B for border:  The edges of the growth are irregular. C for color:  A mixture of colors are present instead of an even brown color. D for diameter:  Melanomas are usually (but not always) greater than 6mm - the size of a pencil eraser. E for evolution:  The spot keeps changing in size, shape, and color.  Please check your skin once per month between visits. You can use a small mirror in front and a large mirror behind you to keep an eye on the back side or your body.   If you see any new or changing lesions before your next follow-up, please call to schedule a visit.  Please continue daily skin protection including broad spectrum sunscreen SPF 30+ to sun-exposed areas, reapplying every 2 hours as needed when you're outdoors.   Staying in the  shade or wearing long sleeves, sun glasses (UVA+UVB protection) and wide brim hats (4-inch brim around the entire circumference of the hat) are also recommended for sun protection.    Due to recent changes in healthcare laws, you may see results of your pathology and/or laboratory studies on MyChart before the doctors have had a chance to review them. We understand that in some cases there may be results that are confusing or concerning to you. Please understand that not all results are received at the same time and often the doctors may need to interpret multiple results in order to provide you with the best plan of care or course of treatment. Therefore, we ask that you please give [place] 2 business days to thoroughly review all your results before contacting the office for clarification. Should we see a critical lab result, you will be contacted sooner.   If You Need Anything After Your Visit  If you have any questions or concerns for your doctor, please call our main line at [phone] and press option 4 to reach your doctor's medical assistant. If no one answers, please leave a voicemail as directed and we will return your call as soon as possible. Messages left after 4 pm will be answered the following business day.   You may also send [place] a message via MyChart. We typically respond to  MyChart messages within 1-2 business days.  For prescription refills, please ask your pharmacy to contact our office. Our fax number is [phone].  If you have an urgent issue when the clinic is closed that cannot wait until the next business day, you can page your doctor at the number below.    Please note that while we do our best to be available for urgent issues outside of office hours, we are not available 24/7.   If you have an urgent issue and are unable to reach [place], you may choose to seek medical care at your doctor's office, retail clinic, urgent care center, or emergency room.  If you have a medical  emergency, please immediately call 911 or go to the emergency department.  Pager Numbers  - Dr. Gwen Pounds: [phone]  - Dr. Roseanne Reno: [phone]  - Dr. Katrinka Blazing: [phone]   In the event of inclement weather, please call our main line at [phone] for an update on the status of any delays or closures.  Dermatology Medication Tips: Please keep the boxes that topical medications come in in order to help keep track of the instructions about where and how to use these. Pharmacies typically print the medication instructions only on the boxes and not directly on the medication tubes.   If your medication is too expensive, please contact our office at [phone] option 4 or send [place] a message through MyChart.   We are unable to tell what your co-pay for medications will be in advance as this is different depending on your insurance coverage. However, we may be able to find a substitute medication at lower cost or fill out paperwork to get insurance to cover a needed medication.   If a prior authorization is required to get your medication covered by your insurance company, please allow [place] 1-2 business days to complete this process.  Drug prices often vary depending on where the prescription is filled and some pharmacies may offer cheaper prices.  The website www.goodrx.com contains coupons for medications through different pharmacies. The prices here do not account for what the cost may be with help from insurance (it may be cheaper with your insurance), but the website can give you the price if you did not use any insurance.  - You can print the associated coupon and take it with your prescription to the pharmacy.  - You may also stop by our office during regular business hours and pick up a GoodRx coupon card.  - If you need your prescription sent electronically to a different pharmacy, notify our office through [facility] or by phone at [phone] option 4.     Si Usted  Necesita Algo Despus de Su Visita  Tambin puede enviarnos un mensaje a travs de Clinical cytogeneticist. Por lo general respondemos a los mensajes de MyChart en el transcurso de 1 a 2 das hbiles.  Para renovar recetas, por favor pida a su farmacia que se ponga en contacto con nuestra oficina. Annie Sable de fax es [place] [phone].  Si tiene un asunto urgente cuando la clnica est cerrada y que no puede esperar hasta el siguiente da hbil, puede llamar/localizar a su doctor(a) al nmero que aparece a continuacin.   Por favor, tenga en cuenta que aunque hacemos todo lo posible para estar disponibles para asuntos urgentes fuera del horario de [place], no estamos disponibles las 24 horas del da, los 7 [address]  [address] de la [place].   Si tiene un problema urgente y no puede comunicarse con nosotros,  puede optar por buscar atencin mdica  en el consultorio de su doctor(a), en una clnica privada, en un centro de atencin urgente o en una sala de emergencias.  Si tiene Engineer, drilling, por favor llame inmediatamente al 911 o vaya a la sala de emergencias.  Nmeros de bper  - Dr. Gwen Pounds: [phone]  - Dra. Roseanne Reno: [phone]  - Dr. Katrinka Blazing: [phone]   En caso de inclemencias del tiempo, por favor llame a Lacy Duverney principal al [phone] para una actualizacin sobre el [place] de cualquier retraso o cierre.  Consejos para la medicacin en dermatologa: Por favor, guarde las cajas en las que vienen los medicamentos de uso tpico para ayudarle a seguir las instrucciones sobre dnde y cmo usarlos. Las farmacias generalmente imprimen las instrucciones del medicamento slo en las cajas y no directamente en los tubos del [place].   Si su medicamento es muy caro, por favor, pngase en contacto con Rolm Gala llamando al [phone] y presione la opcin 4 o envenos un mensaje a travs de Clinical cytogeneticist.   No podemos decirle cul ser su copago por los medicamentos por adelantado ya que esto  es diferente dependiendo de la cobertura de su seguro. Sin embargo, es posible que podamos encontrar un medicamento sustituto a Audiological scientist un formulario para que el seguro cubra el medicamento que se considera necesario.   Si se requiere una autorizacin previa para que su compaa de seguros [place] su medicamento, por favor permtanos de 1 a 2 das hbiles para completar [address]  Los precios de los medicamentos varan con frecuencia dependiendo del Environmental consultant de dnde se surte la receta y alguna farmacias pueden ofrecer precios ms baratos.  El sitio web www.goodrx.com tiene cupones para medicamentos de Health and safety inspector. Los precios aqu no tienen en cuenta lo que podra costar con la ayuda del seguro (puede ser ms barato con su seguro), pero el sitio web puede darle el precio si no utiliz Tourist information centre manager.  - Puede imprimir el cupn correspondiente y llevarlo con su receta a la farmacia.  - Tambin puede pasar por nuestra oficina durante el horario de atencin regular y Education officer, museum una tarjeta de cupones de GoodRx.  - Si necesita que su receta se enve electrnicamente a una farmacia diferente, informe a nuestra oficina a travs de MyChart de [place] o por telfono llamando al [phone] y presione la opcin 4.

## 2022-11-08 NOTE — Progress Notes (Signed)
Follow-Up Visit   Subjective  Natalie Monroe is a 72 y.o. female who presents for the following: Skin Cancer Screening and Full Body Skin Exam Hx of dysplastic nevi x 2, hx of bcc  Noticed a bump below right knee  The patient presents for Total-Body Skin Exam (TBSE) for skin cancer screening and mole check. The patient has spots, moles and lesions to be evaluated, some may be new or changing and the patient may have concern these could be cancer.    The following portions of the chart were reviewed this encounter and updated as appropriate: medications, allergies, medical history  Review of Systems:  No other skin or systemic complaints except as noted in HPI or Assessment and Plan.  Objective  Well appearing patient in no apparent distress; mood and affect are within normal limits.  A full examination was performed including scalp, head, eyes, ears, nose, lips, neck, chest, axillae, abdomen, back, buttocks, bilateral upper extremities, bilateral lower extremities, hands, feet, fingers, toes, fingernails, and toenails. All findings within normal limits unless otherwise noted below.   Relevant physical exam findings are noted in the Assessment and Plan.    Assessment & Plan   SKIN CANCER SCREENING PERFORMED TODAY.  ACTINIC DAMAGE - Chronic condition, secondary to cumulative UV/sun exposure - diffuse scaly erythematous macules with underlying dyspigmentation - Recommend daily broad spectrum sunscreen SPF 30+ to sun-exposed areas, reapply every 2 hours as needed.  - Staying in the shade or wearing long sleeves, sun glasses (UVA+UVB protection) and wide brim hats (4-inch brim around the entire circumference of the hat) are also recommended for sun protection.  - Call for new or changing lesions.  LENTIGINES, SEBORRHEIC KERATOSES, HEMANGIOMAS - Benign normal skin lesions - Benign-appearing - Call for any changes  MELANOCYTIC NEVI - Tan-brown and/or pink-flesh-colored  symmetric macules and papules - 3 mm flesh colored mole at right inferior knee - 4 x 3 mm  med dark brown macule at left abdomen - Benign appearing on exam today - Observation - Call clinic for new or changing moles - Recommend daily use of broad spectrum spf 30+ sunscreen to sun-exposed areas.    IRRITANT CONTACT DERMATITIS Perianal area Exam: pt defers exam today, currently not flared   Chronic and persistent condition with duration or expected duration over one year. Condition is improving with treatment but not currently at goal.    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 Currently well-controlled.  Herpes Simplex Virus = Cold Sores = Fever Blisters is a chronic recurring blistering; scabbing sore-producing viral infection that is recurrent usually in the same area triggered by stress, sun/UV exposure and trauma.  It is infectious and can be spread from person to person by direct contact.  It is not curable, but is treatable with topical and oral medication.  Treatment Plan Change Acyclovir 400 mg tab po bid to take only when flared take 5 tabs po at first onset of symptoms and after 12 hours take 5 tabs. Pt has enough tabs at home    HISTORY OF DYSPLASTIC NEVUS No evidence of recurrence today- R lat breast, R lat foot Recommend regular full body skin exams Recommend daily broad spectrum sunscreen SPF 30+ to sun-exposed areas, reapply every 2 hours as needed.  Call if any  new or changing lesions are noted between office visits    HISTORY OF BASAL CELL CARCINOMA OF THE SKIN - No evidence of recurrence today- R inf nasal ala - Recommend regular full body skin exams - Recommend daily broad spectrum sunscreen SPF 30+ to sun-exposed areas, reapply  every 2 hours as needed.  - Call if any new or changing lesions are noted between office visits     Return in about 1 year (around 11/08/2023) for TBSE.  I, Asher Muir, CMA, am acting as scribe for Willeen Niece, MD.   Documentation: I have reviewed the above documentation for accuracy and completeness, and I agree with the above.  Willeen Niece, MD

## 2023-07-10 ENCOUNTER — Other Ambulatory Visit: Payer: Self-pay | Admitting: Family Medicine

## 2023-07-10 DIAGNOSIS — Z1231 Encounter for screening mammogram for malignant neoplasm of breast: Secondary | ICD-10-CM

## 2023-08-23 ENCOUNTER — Ambulatory Visit
Admission: RE | Admit: 2023-08-23 | Discharge: 2023-08-23 | Disposition: A | Discharge status: Home / Self Care | Source: Ambulatory Visit | Attending: Family Medicine | Admitting: Family Medicine

## 2023-08-23 DIAGNOSIS — Z1231 Encounter for screening mammogram for malignant neoplasm of breast: Secondary | ICD-10-CM | POA: Diagnosis present

## 2023-10-04 ENCOUNTER — Other Ambulatory Visit: Payer: Self-pay

## 2023-10-04 MED ORDER — COMIRNATY 30 MCG/0.3ML IM SUSY
0.3000 mL | PREFILLED_SYRINGE | Freq: Once | INTRAMUSCULAR | 0 refills | Status: AC
  Filled 2023-10-04: qty 0.3, 1d supply, fill #0

## 2023-10-04 MED ORDER — FLUZONE HIGH-DOSE 0.5 ML IM SUSY
0.5000 mL | PREFILLED_SYRINGE | Freq: Once | INTRAMUSCULAR | 0 refills | Status: AC
  Filled 2023-10-04: qty 0.5, 1d supply, fill #0

## 2023-10-11 ENCOUNTER — Other Ambulatory Visit: Payer: Self-pay

## 2023-10-11 ENCOUNTER — Emergency Department
Admission: EM | Admit: 2023-10-11 | Discharge: 2023-10-11 | Disposition: A | Discharge status: Home / Self Care | Attending: Emergency Medicine | Admitting: Emergency Medicine

## 2023-10-11 ENCOUNTER — Encounter: Payer: Self-pay | Admitting: Emergency Medicine

## 2023-10-11 DIAGNOSIS — I10 Essential (primary) hypertension: Secondary | ICD-10-CM | POA: Insufficient documentation

## 2023-10-11 DIAGNOSIS — R55 Syncope and collapse: Secondary | ICD-10-CM | POA: Diagnosis present

## 2023-10-11 DIAGNOSIS — R300 Dysuria: Secondary | ICD-10-CM | POA: Diagnosis not present

## 2023-10-11 DIAGNOSIS — D72829 Elevated white blood cell count, unspecified: Secondary | ICD-10-CM | POA: Insufficient documentation

## 2023-10-11 LAB — CBC
HCT: 40.6 % (ref 36.0–46.0)
Hemoglobin: 13.6 g/dL (ref 12.0–15.0)
MCH: 30.1 pg (ref 26.0–34.0)
MCHC: 33.5 g/dL (ref 30.0–36.0)
MCV: 89.8 fL (ref 80.0–100.0)
Platelets: 243 K/uL (ref 150–400)
RBC: 4.52 MIL/uL (ref 3.87–5.11)
RDW: 12.6 % (ref 11.5–15.5)
WBC: 10.8 K/uL — ABNORMAL HIGH (ref 4.0–10.5)
nRBC: 0 % (ref 0.0–0.2)

## 2023-10-11 LAB — URINALYSIS, ROUTINE W REFLEX MICROSCOPIC
Bacteria, UA: NONE SEEN
Bilirubin Urine: NEGATIVE
Glucose, UA: NEGATIVE mg/dL
Hgb urine dipstick: NEGATIVE
Ketones, ur: 5 mg/dL — AB
Nitrite: NEGATIVE
Protein, ur: NEGATIVE mg/dL
Specific Gravity, Urine: 1.016 (ref 1.005–1.030)
Squamous Epithelial / HPF: 0 /HPF (ref 0–5)
pH: 7 (ref 5.0–8.0)

## 2023-10-11 LAB — COMPREHENSIVE METABOLIC PANEL WITH GFR
ALT: 15 U/L (ref 0–44)
AST: 20 U/L (ref 15–41)
Albumin: 3.8 g/dL (ref 3.5–5.0)
Alkaline Phosphatase: 72 U/L (ref 38–126)
Anion gap: 11 (ref 5–15)
BUN: 16 mg/dL (ref 8–23)
CO2: 25 mmol/L (ref 22–32)
Calcium: 8.6 mg/dL — ABNORMAL LOW (ref 8.9–10.3)
Chloride: 104 mmol/L (ref 98–111)
Creatinine, Ser: 0.64 mg/dL (ref 0.44–1.00)
GFR, Estimated: >60 mL/min (ref >60)
Glucose, Bld: 148 mg/dL — ABNORMAL HIGH (ref 70–99)
Potassium: 4.1 mmol/L (ref 3.5–5.1)
Sodium: 140 mmol/L (ref 135–145)
Total Bilirubin: 1.3 mg/dL — ABNORMAL HIGH (ref 0.0–1.2)
Total Protein: 6.1 g/dL — ABNORMAL LOW (ref 6.5–8.1)

## 2023-10-11 LAB — TROPONIN I (HIGH SENSITIVITY): Troponin I (High Sensitivity): 3 ng/L (ref <18)

## 2023-10-11 MED ORDER — SODIUM CHLORIDE 0.9 % IV SOLN
Freq: Once | INTRAVENOUS | Status: AC
Start: 2023-10-11 — End: 2023-10-11

## 2023-10-11 MED ORDER — ONDANSETRON HCL 4 MG/2ML IJ SOLN
4.0000 mg | Freq: Once | INTRAMUSCULAR | Status: AC
Start: 2023-10-11 — End: 2023-10-11
  Administered 2023-10-11: 4 mg via INTRAVENOUS
  Filled 2023-10-11: qty 2

## 2023-10-11 MED ORDER — PROMETHAZINE HCL 25 MG/ML IJ SOLN
12.5000 mg | Freq: Once | INTRAMUSCULAR | Status: AC
  Administered 2023-10-11: 12.5 mg via INTRAVENOUS
  Filled 2023-10-11: qty 12.5

## 2023-10-11 NOTE — Discharge Instructions (Signed)
 Your lab work in the emergency department was overall okay.  You had a mild elevation of your white count but did not have any obvious findings of a infection.  It is importantly follow-up closely with your primary care physician so they can recheck your lab work and blood pressure.  Hold off on any muscle relaxers.  You can use a Lidoderm patch for your back pain and alternate Motrin and Tylenol .  Stay hydrated and drink plenty fluids.  Return to the emergency department for any worsening symptoms.

## 2023-10-11 NOTE — ED Triage Notes (Signed)
 Patient to ED via [facility] from home for syncope. PT was sitting at the table when this occurred. Had 1 episode of vomiting. Denies hitting head, Aox4. C/o lower back pain that started last night.  20 L forearm- given 4mg  zofran  and NaCL 137 cbg 67 HR 97% RA 114/71

## 2023-10-11 NOTE — ED Provider Notes (Signed)
 [facility] Provider Note    Event Date/Time   First MD Initiated Contact with Patient 10/11/23 1238     (approximate)   History   Loss of Consciousness   HPI  Natalie Monroe is a 73 y.o. female who has a history of hypertension who presents after a syncopal episode.  Patient reports that this morning after breakfast she felt lightheaded and apparently passed out the table.  She had been feeling well until that point.  No chest pain.  No abdominal pain.  Yesterday had a right low back muscle spasm which is not atypical for her for which she took baclofen, she took another 1 this morning about half an hour prior to eating.     Physical Exam   Triage Vital Signs: ED Triage Vitals  Encounter Vitals Group     BP 10/11/23 1125 128/72     Girls Systolic BP Percentile --      Girls Diastolic BP Percentile --      Boys Systolic BP Percentile --      Boys Diastolic BP Percentile --      Pulse Rate 10/11/23 1125 61     Resp 10/11/23 1125 17     Temp 10/11/23 1127 97.6 F (36.4 C)     Temp Source 10/11/23 1127 Oral     SpO2 10/11/23 1125 100 %     Weight 10/11/23 1126 52.2 kg (115 lb)     Height 10/11/23 1126 1.524 m (5')     Head Circumference --      Peak Flow --      Pain Score 10/11/23 1125 8     Pain Loc --      Pain Education --      Exclude from Growth Chart --     Most recent vital signs: Vitals:   10/11/23 1125 10/11/23 1127  BP: 128/72   Pulse: 61   Resp: 17   Temp:  97.6 F (36.4 C)  SpO2: 100%      General: Awake, no distress.  CV:  Good peripheral perfusion.  Regular rate and rhythm, clear to auscultation bilaterally Resp:  Normal effort.  Abd:  No distention.  No CVA tenderness, abdomen soft, nontender Other:  Back: Mild lumbar paraspinal tenderness consistent with spasm   ED Results / Procedures / Treatments   Labs (all labs ordered are listed, but only abnormal results are displayed) Labs Reviewed  COMPREHENSIVE  METABOLIC PANEL WITH GFR - Abnormal; Notable for the following components:      Result Value   Glucose, Bld 148 (*)    Calcium 8.6 (*)    Total Protein 6.1 (*)    Total Bilirubin 1.3 (*)    All other components within normal limits  CBC - Abnormal; Notable for the following components:   WBC 10.8 (*)    All other components within normal limits  URINALYSIS, ROUTINE W REFLEX MICROSCOPIC  CBG MONITORING, ED  TROPONIN I (HIGH SENSITIVITY)     EKG  ED ECG REPORT I, Lamar Price, the attending physician, personally viewed and interpreted this ECG.  Date: 10/11/2023  Rhythm: normal sinus rhythm QRS Axis: normal Intervals: normal ST/T Wave abnormalities: normal Narrative Interpretation: no evidence of acute ischemia    RADIOLOGY     PROCEDURES:  Critical Care performed:   Procedures   MEDICATIONS ORDERED IN ED: Medications  promethazine  (PHENERGAN ) 12.5 mg in sodium chloride  0.9 % 50 mL IVPB (has no administration in time  range)  0.9 %  sodium chloride  infusion ( Intravenous New Bag/Given 10/11/23 1449)  ondansetron  (ZOFRAN ) injection 4 mg (4 mg Intravenous Given 10/11/23 1434)     IMPRESSION / MDM / ASSESSMENT AND PLAN / ED COURSE  I reviewed the triage vital signs and the nursing notes. Patient's presentation is most consistent with acute presentation with potential threat to life or bodily function.  Patient presents after a syncopal episode, now with nausea.  Differential includes vasovagal syncope, electrolyte abnormality dehydration, doubt arrhythmia or cardiac cause  Will treat with IV fluids, IV Zofran   Lab work reviewed and is overall reassuring  Patient with continued nausea will give a dose of IV Phenergan , there is been a delay in the IV fluids because of difficult access  Have asked my colleague to follow-up on the patient    Clinical Course as of 10/11/23 1521  Wed Oct 11, 2023  1513 Syncope with nausea, overall well appearing - given IVF and  antiemetics.  Labs reassuring.  []  follow up urine and fluids. Likely dc home [SM]    Clinical Course User Index [SM] Suzanne Kirsch, MD     FINAL CLINICAL IMPRESSION(S) / ED DIAGNOSES   Final diagnoses:  Syncope, unspecified syncope type     Rx / DC Orders   ED Discharge Orders     None        Note:  This document was prepared using Dragon voice recognition software and may include unintentional dictation errors.   Arlander Charleston, MD 10/11/23 [phone]

## 2023-10-11 NOTE — ED Notes (Signed)
 Orthostatic Vitals:  Lying Down: BP: 125/68 (84) Pulse: 72 Sitting Up: BP: 138/81 (100) Pulse: 70 Standing Up: BP: 146/72 (94) Pulse: 70

## 2023-10-11 NOTE — ED Provider Notes (Addendum)
 Care assumed of patient from outgoing provider.  See their note for initial history, exam and plan.  Clinical Course as of 10/11/23 1722  Wed Oct 11, 2023  1513 Syncope with nausea, overall well appearing - given IVF and antiemetics.  Labs reassuring.  []  follow up urine and fluids. Likely dc home [SM]    Clinical Course User Index [SM] Suzanne Kirsch, MD     Reevaluation patient states she is feeling much better.  No longer with nausea and normal gait in the emergency department.  Not lightheaded with standing.  States she feels much better wants to go home.  Lab work overall reassuring.  Urine without an obvious finding of a urinary tract infection and does not have any symptoms.  Likely in the setting of taking muscle relaxers.  Patient will follow-up closely with her primary care physician and return to the emergency department for any ongoing or worsening symptoms.  Suzanne Kirsch, MD 10/11/23 BERYL    Suzanne Kirsch, MD 10/11/23 1723

## 2023-10-11 NOTE — ED Notes (Signed)
 Attempted to get labs from IV and straight stick with no success. Lab called at this time.

## 2023-10-11 NOTE — ED Notes (Signed)
 Passed po challenge

## 2023-10-12 LAB — URINE CULTURE

## 2023-10-14 ENCOUNTER — Emergency Department
Admission: EM | Admit: 2023-10-14 | Discharge: 2023-10-14 | Disposition: A | Discharge status: Home / Self Care | Attending: Emergency Medicine | Admitting: Emergency Medicine

## 2023-10-14 ENCOUNTER — Emergency Department

## 2023-10-14 DIAGNOSIS — R002 Palpitations: Secondary | ICD-10-CM | POA: Diagnosis present

## 2023-10-14 DIAGNOSIS — I4891 Unspecified atrial fibrillation: Secondary | ICD-10-CM | POA: Diagnosis not present

## 2023-10-14 LAB — CBC
HCT: 42.9 % (ref 36.0–46.0)
Hemoglobin: 14.8 g/dL (ref 12.0–15.0)
MCH: 30.9 pg (ref 26.0–34.0)
MCHC: 34.5 g/dL (ref 30.0–36.0)
MCV: 89.6 fL (ref 80.0–100.0)
Platelets: 277 K/uL (ref 150–400)
RBC: 4.79 MIL/uL (ref 3.87–5.11)
RDW: 12.7 % (ref 11.5–15.5)
WBC: 9.5 K/uL (ref 4.0–10.5)
nRBC: 0 % (ref 0.0–0.2)

## 2023-10-14 LAB — BASIC METABOLIC PANEL WITH GFR
Anion gap: 12 (ref 5–15)
BUN: 14 mg/dL (ref 8–23)
CO2: 23 mmol/L (ref 22–32)
Calcium: 9 mg/dL (ref 8.9–10.3)
Chloride: 106 mmol/L (ref 98–111)
Creatinine, Ser: 0.96 mg/dL (ref 0.44–1.00)
GFR, Estimated: >60 mL/min (ref >60)
Glucose, Bld: 161 mg/dL — ABNORMAL HIGH (ref 70–99)
Potassium: 3.9 mmol/L (ref 3.5–5.1)
Sodium: 141 mmol/L (ref 135–145)

## 2023-10-14 LAB — TROPONIN I (HIGH SENSITIVITY): Troponin I (High Sensitivity): 6 ng/L (ref <18)

## 2023-10-14 LAB — MAGNESIUM: Magnesium: 2.3 mg/dL (ref 1.7–2.4)

## 2023-10-14 MED ORDER — IOHEXOL 350 MG/ML SOLN
75.0000 mL | Freq: Once | INTRAVENOUS | Status: AC | PRN
Start: 2023-10-14 — End: 2023-10-14
  Administered 2023-10-14: 75 mL via INTRAVENOUS

## 2023-10-14 MED ORDER — METOPROLOL TARTRATE 50 MG PO TABS: 50.0000 mg | ORAL_TABLET | Freq: Two times a day (BID) | ORAL | 11 refills | Status: DC

## 2023-10-14 MED ORDER — APIXABAN 5 MG PO TABS
5.0000 mg | ORAL_TABLET | Freq: Once | ORAL | Status: AC
  Administered 2023-10-14: 5 mg via ORAL
  Filled 2023-10-14: qty 1

## 2023-10-14 MED ORDER — METOPROLOL TARTRATE 5 MG/5ML IV SOLN
5.0000 mg | Freq: Once | INTRAVENOUS | Status: AC
  Administered 2023-10-14: 5 mg via INTRAVENOUS
  Filled 2023-10-14: qty 5

## 2023-10-14 MED ORDER — METOPROLOL TARTRATE 25 MG PO TABS
25.0000 mg | ORAL_TABLET | Freq: Once | ORAL | Status: AC
  Administered 2023-10-14: 25 mg via ORAL
  Filled 2023-10-14: qty 1

## 2023-10-14 MED ORDER — APIXABAN 5 MG PO TABS: 5.0000 mg | ORAL_TABLET | Freq: Two times a day (BID) | ORAL | 2 refills | Status: AC

## 2023-10-14 NOTE — Discharge Instructions (Signed)
--  Metoprolol tartrate (the short acting version) 50mg  twice per day every day. Stop taking the previous metoprolol that you were prescribed (metoprolol succinate/Toprol-XL), after you pick up this new medication  If you have an episode of palpitations or feeling like you are in fast A-fib again, it is okay to take an extra dose of the metoprolol tartrate that we are prescribing you 1 time.  Eliquis twice daily to help prevent strokes.  See attached precautions for starting anticoagulants  Cardiology clinic should call you to follow-up with them  Return to the ED with any worsening symptoms despite these measures

## 2023-10-14 NOTE — ED Triage Notes (Signed)
 Pt c/o heart fluttering, chest tightness, sweating, and hypotension this morning.  Pain score 1/10.  Pt was seen 3 days ago after a syncopal episode and sts they ruled out cardiac issues.  Pt's husband reports BP 90 over something prior to arrival.

## 2023-10-14 NOTE — ED Provider Notes (Signed)
 [facility] Provider Note    Event Date/Time   First MD Initiated Contact with Patient 10/14/23 1128     (approximate)   History   Hypotension and Palpitations   HPI  Natalie Monroe is a 73 y.o. female who presents to the ED for evaluation of Hypotension and Palpitations   I reviewed ED visit from 3 days ago where patient was seen after syncopal episode.  Benign workup, feeling well after some IV fluids and antiemetics discharged with return precautions.  Patient presents to the ED with palpitations just this morning, no further syncopal episodes   Physical Exam   Triage Vital Signs: ED Triage Vitals  Encounter Vitals Group     BP 10/14/23 1120 (!) 113/97     Girls Systolic BP Percentile --      Girls Diastolic BP Percentile --      Boys Systolic BP Percentile --      Boys Diastolic BP Percentile --      Pulse Rate 10/14/23 1120 89     Resp 10/14/23 1120 16     Temp 10/14/23 1120 98.3 F (36.8 C)     Temp src --      SpO2 10/14/23 1120 99 %     Weight 10/14/23 1121 115 lb (52.2 kg)     Height 10/14/23 1121 5' (1.524 m)     Head Circumference --      Peak Flow --      Pain Score 10/14/23 1121 1     Pain Loc --      Pain Education --      Exclude from Growth Chart --     Most recent vital signs: Vitals:   10/14/23 1430 10/14/23 1455  BP:  134/79  Pulse: 78 74  Resp: 13 14  Temp:    SpO2: 96% 99%    General: Awake, no distress.  Generally well-appearing CV:  Good peripheral perfusion.  Tachycardic and irregular Resp:  Normal effort.  Abd:  No distention.  MSK:  No deformity noted.  Neuro:  No focal deficits appreciated. Other:     ED Results / Procedures / Treatments   Labs (all labs ordered are listed, but only abnormal results are displayed) Labs Reviewed  BASIC METABOLIC PANEL WITH GFR - Abnormal; Notable for the following components:      Result Value   Glucose, Bld 161 (*)    All other components within normal  limits  CBC  MAGNESIUM  TROPONIN I (HIGH SENSITIVITY)  TROPONIN I (HIGH SENSITIVITY)    EKG A-fib with RVR, rate of 169 bpm.  Nonspecific changes without STEMI.  RADIOLOGY CTA chest interpreted by me without evidence of acute cardiopulmonary pathology  Official radiology report(s): CT Angio Chest PE W and/or Wo Contrast Result Date: 10/14/2023 EXAM: CTA CHEST 10/14/2023 12:57:44 PM TECHNIQUE: CTA of the chest was performed without and with the administration of 75 mL of iohexol  (OMNIPAQUE ) 350 MG/ML injection. Multiplanar reformatted images are provided for review. MIP images are provided for review. Automated exposure control, iterative reconstruction, and/or weight based adjustment of the mA/kV was utilized to reduce the radiation dose to as low as reasonably achievable. COMPARISON: None available. CLINICAL HISTORY: eval PE. new onset Afib, chest pain, syncope. Pt c/o heart fluttering, chest tightness, sweating, and hypotension this morning. Pain score 1/10. Pt was seen 3 days ago after a syncopal episode and sts they ruled out cardiac issues.; Pt's husband reports BP 90 over something prior  to arrival. FINDINGS: PULMONARY ARTERIES: Pulmonary arteries are adequately opacified for evaluation. No acute pulmonary embolus. Main pulmonary artery is normal in caliber. MEDIASTINUM: The heart demonstrates coronary artery calcifications. No pericardial effusion. Aortic atherosclerosis. There is no acute abnormality of the thoracic aorta. 1.4 cm left lobe of thyroid gland nodule identified. LYMPH NODES: No mediastinal, hilar or axillary lymphadenopathy. LUNGS AND PLEURA: Central airways are patent. Calcified subpleural nodule in the periphery of the right lung compatible with a benign granuloma. No focal consolidation or pulmonary edema. No evidence of pleural effusion or pneumothorax. UPPER ABDOMEN: Limited images of the upper abdomen are unremarkable. SOFT TISSUES AND BONES: Thoracic degenerative disc  disease. No acute bone or soft tissue abnormality. IMPRESSION: 1. No pulmonary embolism. 2. Incidental 1.4 cm left thyroid nodule; consider dedicated thyroid ultrasound for further characterization. Electronically signed by: Waddell Calk MD 10/14/2023 01:21 PM EDT RP Workstation: [id number]    PROCEDURES and INTERVENTIONS:  .Critical Care  Performed by: Claudene Rover, MD Authorized by: Claudene Rover, MD   Critical care provider statement:    Critical care time (minutes):  45   Critical care time was exclusive of:  Separately billable procedures and treating other patients   Critical care was necessary to treat or prevent imminent or life-threatening deterioration of the following conditions:  Cardiac failure and circulatory failure   Critical care was time spent personally by me on the following activities:  Development of treatment plan with patient or surrogate, discussions with consultants, evaluation of patient's response to treatment, examination of patient, ordering and review of laboratory studies, ordering and review of radiographic studies, ordering and performing treatments and interventions, pulse oximetry, re-evaluation of patient's condition and review of old charts .1-3 Lead EKG Interpretation  Performed by: Claudene Rover, MD Authorized by: Claudene Rover, MD     Interpretation: abnormal     ECG rate:  141   ECG rate assessment: tachycardic     Rhythm: atrial fibrillation     Ectopy: none     Conduction: normal   .Ultrasound ED Peripheral IV (Provider)  Date/Time: 10/14/2023 3:12 PM  Performed by: Claudene Rover, MD Authorized by: Claudene Rover, MD   Procedure details:    Indications: multiple failed IV attempts and poor IV access     Skin Prep: chlorhexidine gluconate     Location: left cephalic v.   Angiocath:  20 G   Bedside Ultrasound Guided: Yes     Images: not archived     Patient tolerated procedure without complications: Yes     Dressing applied: Yes      Medications  metoprolol tartrate (LOPRESSOR) tablet 25 mg (25 mg Oral Given 10/14/23 1155)  metoprolol tartrate (LOPRESSOR) injection 5 mg (5 mg Intravenous Given 10/14/23 1155)  iohexol  (OMNIPAQUE ) 350 MG/ML injection 75 mL (75 mLs Intravenous Contrast Given 10/14/23 1251)  apixaban (ELIQUIS) tablet 5 mg (5 mg Oral Given 10/14/23 1448)     IMPRESSION / MDM / ASSESSMENT AND PLAN / ED COURSE  I reviewed the triage vital signs and the nursing notes.  Differential diagnosis includes, but is not limited to, ACS, PTX, PNA, muscle strain/spasm, PE, dissection, anxiety, pleural effusion  {Patient presents with symptoms of an acute illness or injury that is potentially life-threatening.  Patient presents with signs of new onset A-fib with A-fib with RVR, ultimately suitable for outpatient management with cardiology follow-up.  Rapid A-fib but stable, converts back to a sinus rhythm with oral and IV metoprolol in addition to her home baseline metoprolol  succinate.  Workup is subsequently benign without signs of PE, lecture light derangements, elevated troponin or hematologic derangements.  I considered admission for this patient but believe she be suitable for outpatient management with cardiology follow-up.  Discharge on Eliquis for stroke prevention and after discussing with cardiology on the phone, augmented metoprolol regimen.  Discussed ED return precautions.  Clinical Course as of 10/14/23 1512  Sat Oct 14, 2023  1157 USIV left cephalic v. 20g  [DS]  1258 Repeat EKG with sinus rhythm with rates in the 80s. [DS]  1428 Reassessed, patient reports feeling fine, asymptomatic, has remained in a sinus rhythm since converting and does not return to A-fib.  Long discussion regarding management of A-fib and my recommendation to initiate anticoagulation due to stroke risk.  She is agreeable.  Discussed starting higher dose of metoprolol at home, Eliquis, cardiology referral, PCP follow-up, ED return  precautions.  Answered the questions to the best of my ability. [DS]  1431 Patient had clarified that while she is prescribed metoprolol succinate 100 mg once daily she takes a half tab twice daily.  I consult with cardiology on-call, Dr. Darliss. Recommends tartrate 50mg  bid [DS]    Clinical Course User Index [DS] Claudene Rover, MD     FINAL CLINICAL IMPRESSION(S) / ED DIAGNOSES   Final diagnoses:  Atrial fibrillation with RVR (HCC)  New onset atrial fibrillation (HCC)     Rx / DC Orders   ED Discharge Orders          Ordered    apixaban (ELIQUIS) 5 MG TABS tablet  2 times daily        10/14/23 1436    metoprolol tartrate (LOPRESSOR) 50 MG tablet  2 times daily        10/14/23 1436    Ambulatory referral to Cardiology       Comments: If you have not heard from the Cardiology office within the next 72 hours please call [phone].   10/14/23 1436             Note:  This document was prepared using Dragon voice recognition software and may include unintentional dictation errors.   Claudene Rover, MD 10/14/23 [phone]

## 2023-10-16 IMAGING — MR MR CERVICAL SPINE W/O CM
5 series · 38 of 48 positions shown · non-contrast
Comparison: None.

CLINICAL DATA: Pain, stiffness

EXAM:
MRI CERVICAL SPINE WITHOUT CONTRAST
TECHNIQUE: Multiplanar, multisequence MR imaging of the cervical spine was
performed. No intravenous contrast was administered.

[Series 5: T2 · sagittal · 3.0mm · 0.62mm/px · 6 of 15 slices shown (1 of 2)]
[im 1/15]
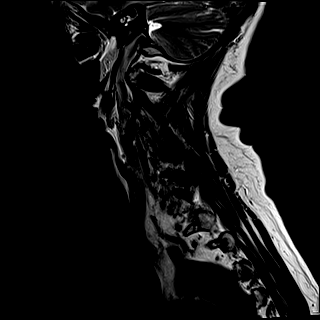
[im 3/15]
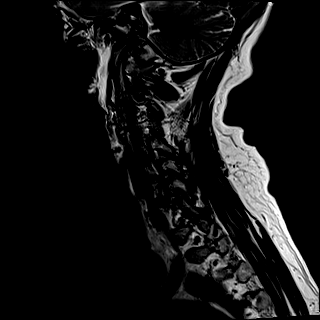
[im 6/15]
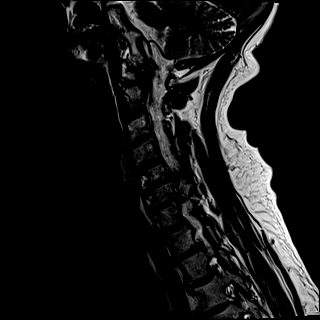
[im 9/15]
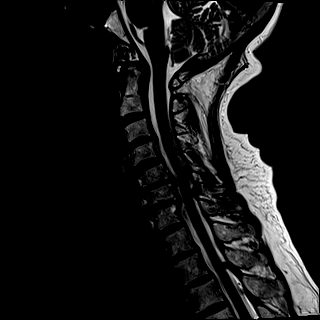
[im 12/15]
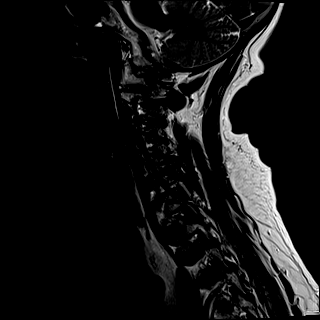
[im 15/15]
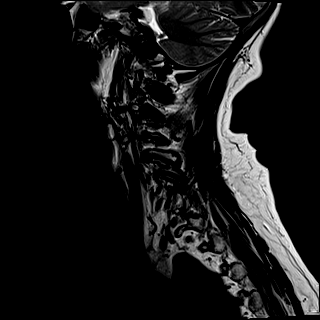

[Series 6: FLAIR · sagittal · 3.0mm · 0.78mm/px · 7 of 15 slices shown]
[im 1/15]
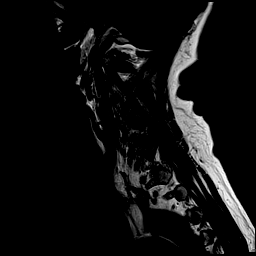
[im 3/15]
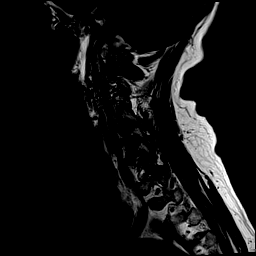
[im 5/15]
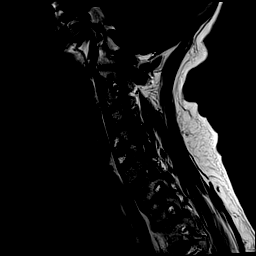
[im 8/15]
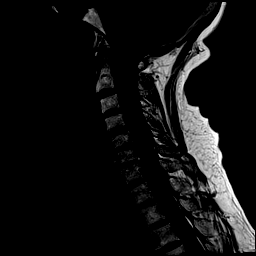
[im 10/15]
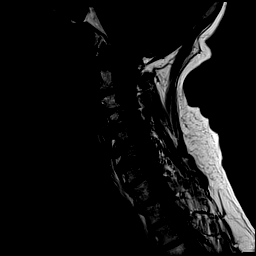
[im 12/15]
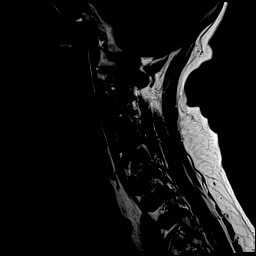
[im 15/15]
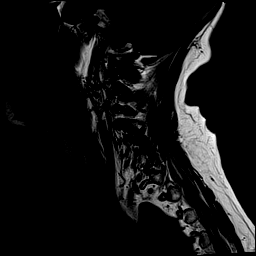

[Series 7: STIR · sagittal · 3.0mm · 0.62mm/px · 7 of 15 slices shown]
[im 1/15]
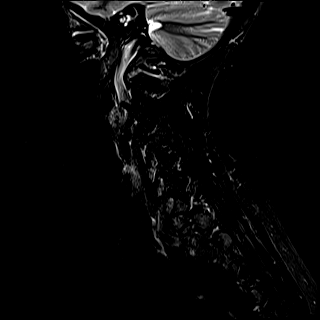
[im 3/15]
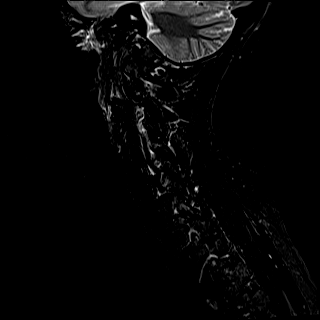
[im 5/15]
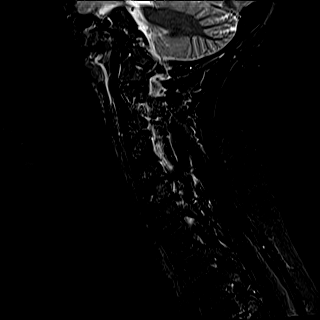
[im 8/15]
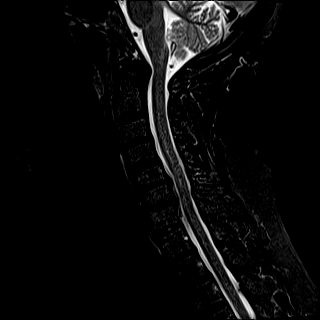
[im 10/15]
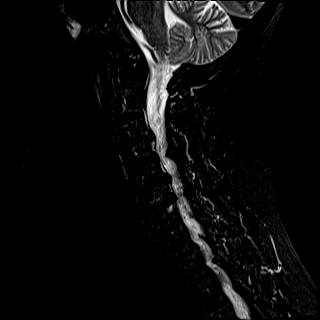
[im 12/15]
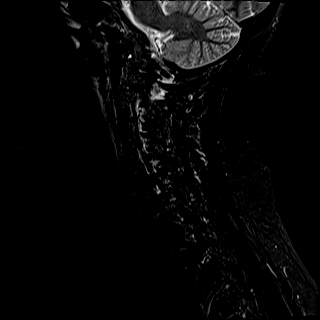
[im 15/15]
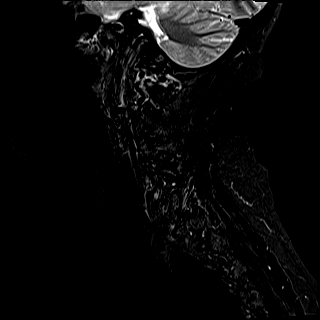

[Series 8: T2 · axial · 3.0mm · 0.70mm/px · z∈[-126,-31]mm · 10 of 31 slices shown (2 of 2)]
[im 1/31]
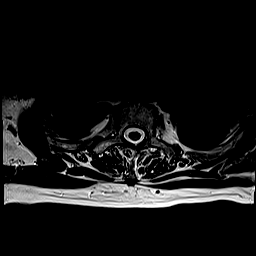
[im 3/31]
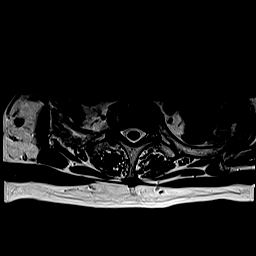
[im 5/31]
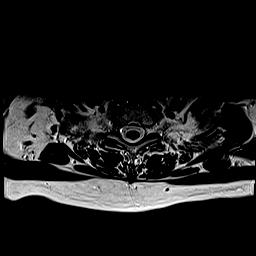
[im 7/31]
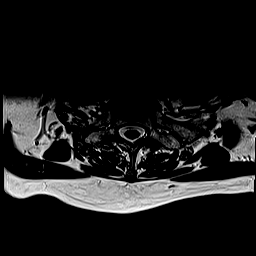
[im 10/31]
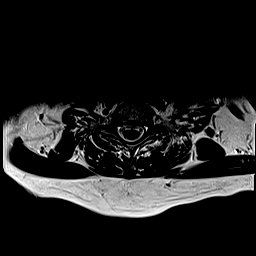
[im 14/31]
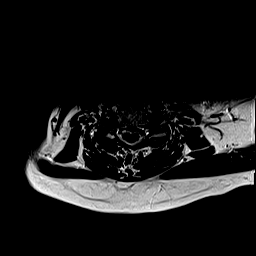
[im 17/31]
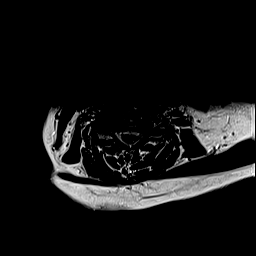
[im 21/31]
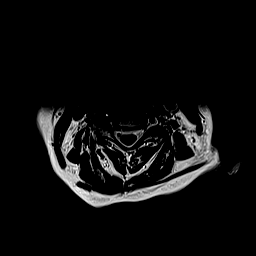
[im 26/31]
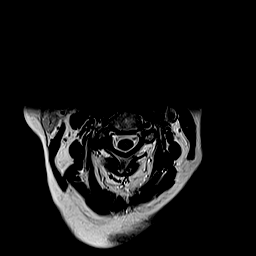
[im 31/31]
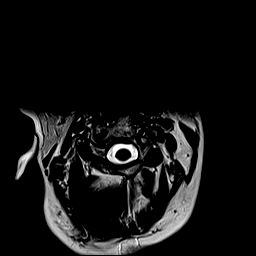

[Series 9: ax mpgr · axial · 3.0mm · 0.35mm/px · z∈[-126,-31]mm · 8 of 31 slices shown]
[im 1/31]
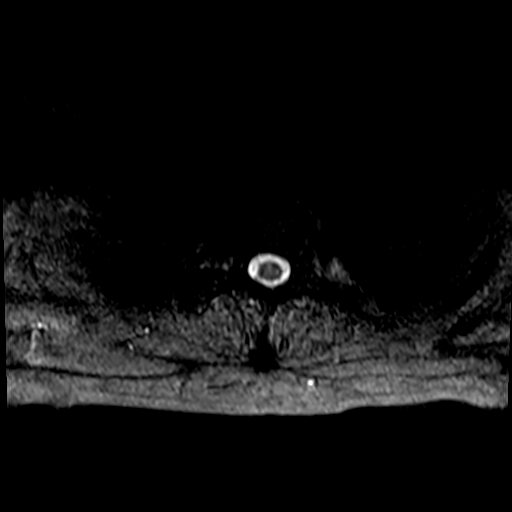
[im 5/31]
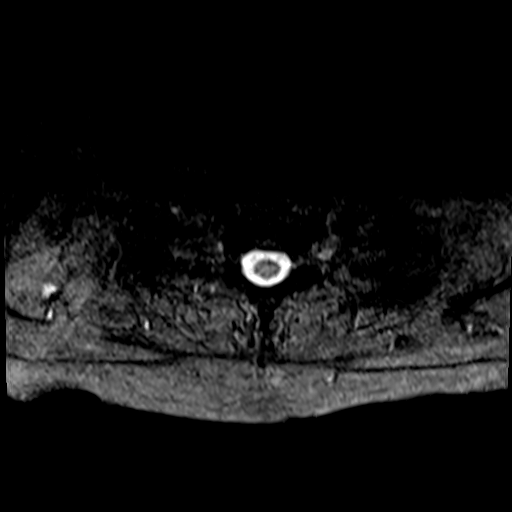
[im 10/31]
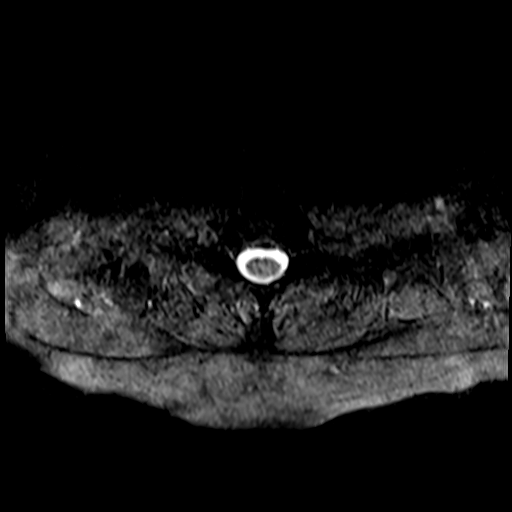
[im 14/31]
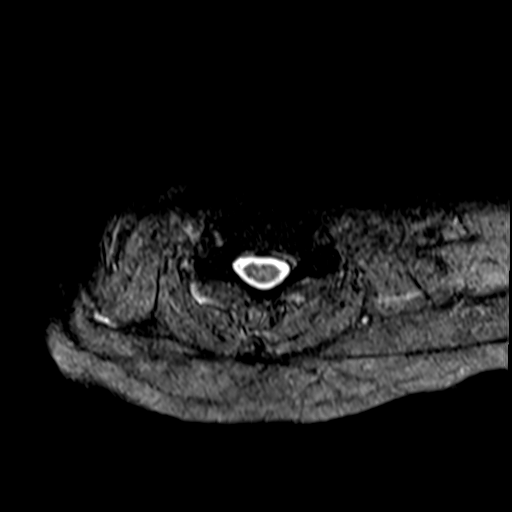
[im 17/31]
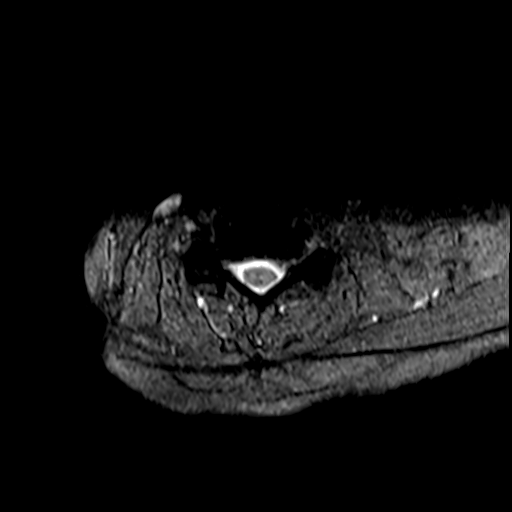
[im 21/31]
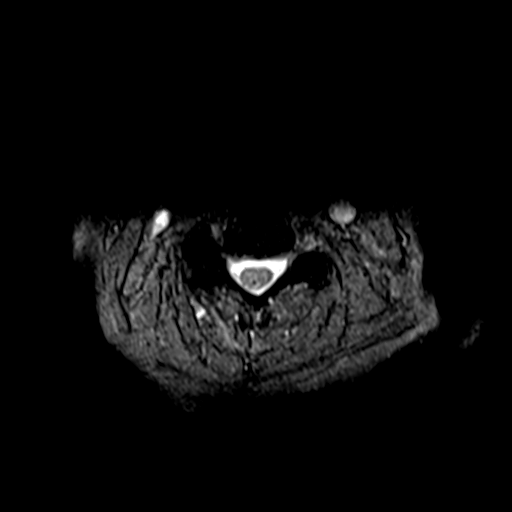
[im 26/31]
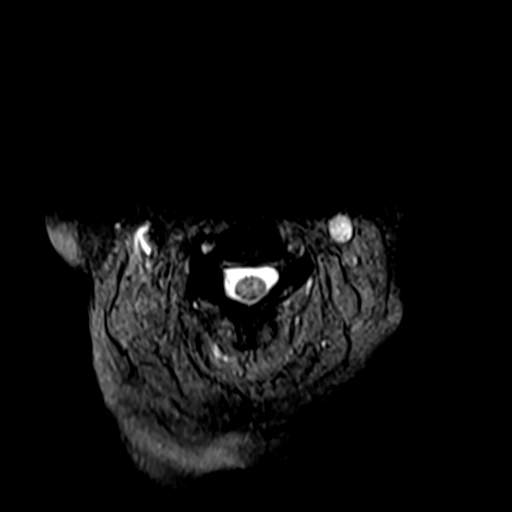
[im 31/31]
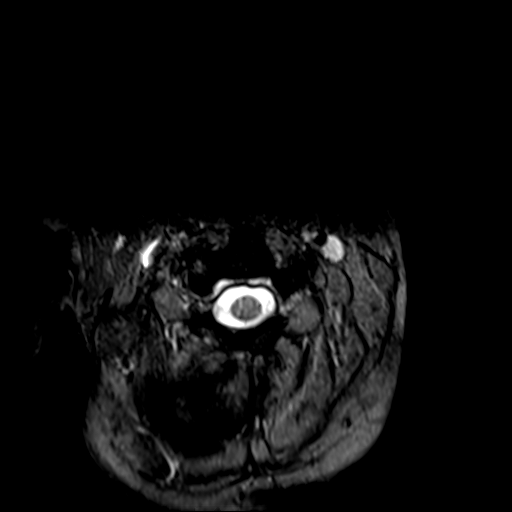

[38 of 48 positions shown; findings below may reference images not displayed]

FINDINGS: Alignment: Straightening of the normal cervical lordosis. No
significant listhesis.

Vertebrae: No acute fracture or suspicious osseous lesion.

Cord: Normal signal and morphology.

Posterior Fossa, vertebral arteries, paraspinal tissues: Negative.

Disc levels:

C2-C3: No significant disc bulge. Right-greater-than-left facet
arthropathy. No spinal canal stenosis or neural foraminal narrowing.

C3-C4: Mild disc bulge, somewhat eccentric to the right.
Right-greater-than-left facet and uncovertebral hypertrophy. No
spinal canal stenosis. Moderate right and mild left neural foraminal
narrowing.

C4-C5: Minimal disc bulge. Right-greater-than-left facet and
uncovertebral hypertrophy. No spinal canal stenosis. Moderate right
neural foraminal narrowing.

C5-C6: Broad-based disc bulge with superimposed left subarticular
protrusion. Facet and uncovertebral hypertrophy. Mild spinal canal
stenosis. Severe right and moderate left neural foraminal narrowing.

C6-C7: Moderate disc bulge. Mild-to-moderate spinal canal stenosis.
Uncovertebral and facet arthropathy. Moderate bilateral neural
foraminal narrowing.

C7-T1: No significant disc bulge. Left-greater-than-right facet
arthropathy. Moderate right and severe left neural foraminal
narrowing.
IMPRESSION: 1. C6-C7 mild-to-moderate spinal canal stenosis and moderate
bilateral neural foraminal narrowing.
2. C5-C6 mild spinal canal stenosis, with severe right and moderate
left neural foraminal narrowing.
3. No spinal canal stenosis is seen at the other cervical levels,
however facet and uncovertebral hypertrophy cause severe neural
foraminal narrowing on the left at C7-T1 and moderate neural
foraminal narrowing on the right at C3-C4, C4-C5, and C7-T1, in
addition to the levels described above.

## 2023-10-28 NOTE — Progress Notes (Signed)
 Chief Complaint:   Chief Complaint  Patient presents with  . Hematuria    Pt seen by [name] 9/30 Cefdinirx2 days, then Macrobid x5 days Pt states while using antibiotics hematuria was gone, 2 days after stopping she noticed it both when wiping and in the toilet Pt describes it as pinkish brown  Last 2 days pt also having lower left sided back pain Denies abd pain, pain/frequency w/ urinating    Subjective:   Natalie Monroe is a 73 y.o. female established patient in today for:  HPI Patient presents to clinic with complaint of hematuria. Patient was seen by this provider last week for hematuria. Of note, patient recently diagnosed with afib - started on eliquis and metoprolol tartrate. Has followed up with cardiology. Records reviewed. She did have 7 RBCs/hpf on previous u/a from 9/30. WBC was okay. Hemoglobin okay. Creatinine 0.5. Abdominal x-ray, did not show any obvious calcifications to suggest a kidney stone. She was started on cefdinir; however, did not tolerate - diarrhea. She was subsequently switched to macrobid. Urine culture returned and <10K colony forming units. Patient reports that she did not see any frank blood in urine by day 6. No blood on urine day 7. On the third day after completing antibiotics she reports she noticed hematuria again. Reports it is intermittent and pink/brown in color. States by the end of the day she does experience some left sided low back pain. She denies any falls or injuries. Pain does not radiate. No numbness or tingling. No flank pain. She denies fever, dyspnea, chest pain, abdominal pain, dysuria, frequency, urgency, malodorous urine, vaginal discharge or bleeding, or any other complaints. She is scheduled to see urology next week (was able to get the appointment moved up).      Past Medical History:  Diagnosis Date  . A-fib (CMS/HHS-HCC) 10/14/2023  . Atrial fib/flutter, transient (CMS/HHS-HCC)   . Breast cancer (CMS/HHS-HCC)   . Breast  cancer, female (CMS/HHS-HCC) 2006  . Carpal tunnel syndrome   . Carpal tunnel syndrome, bilateral   . Hypertension   . Lichen sclerosus   . Migraine headache   . Oral herpes   . Pure hypercholesterolemia   . TGA (transient global amnesia) 05/22/2013   01/26/2013    Past Surgical History:  Procedure Laterality Date  . HYSTERECTOMY VAGINAL  12/2001  . BREAST SURGERY Right 2006   lumpectomy then partial   . COLONOSCOPY  11/14/2005   Int Hemorrhoids: CBF 10/2015; Recall Ltr mailed 09/01/2015 (dw)  . Left Carpal Tunnel Release Left 09/06/2013  . COLONOSCOPY  12/22/2015   Int Hemorrhoids: CBF 12/2015  . carpal tunnel release surgery Right 06/18/2018   [facility]  . APPENDECTOMY    . APPENDECTOMY    . CARPAL TUNNEL RELEASE    . DILATION AND CURETTAGE, DIAGNOSTIC / THERAPEUTIC    . HYSTERECTOMY    . MASTECTOMY PARTIAL / LUMPECTOMY    . OOPHORECTOMY    . PILONIDAL CYST / SINUS EXCISION    . TVH-BSO      Outpatient Medications Prior to Visit  Medication Sig Dispense Refill  . acyclovir  (ZOVIRAX ) 400 MG tablet     . ALPRAZolam (XANAX) 0.25 MG tablet Take 1/2-1 tab qd prn 10 tablet 0  . apixaban (ELIQUIS) 5 mg tablet Take 1 tablet (5 mg total) by mouth 2 (two) times daily 180 tablet 2  . atorvastatin (LIPITOR) 20 MG tablet Take 1 tablet (20 mg total) by mouth once daily 90 tablet 0  . baclofen (  LIORESAL) 10 MG tablet Take 1 tablet (10 mg total) by mouth every 8 (eight) hours as needed 90 tablet 0  . calcium carbonate-vitamin D3 (OS-CAL 500+D) 500 mg(1,250mg ) -200 unit tablet Take 1 tablet by mouth 2 (two) times daily with meals.    . cholecalciferol (VITAMIN D3) 1000 unit tablet Take by mouth    . coenzyme Q10 10 mg capsule Take 10 mg by mouth once daily.    . cyanocobalamin (VITAMIN B12) 1000 MCG tablet Take 1,000 mcg by mouth once daily.    . hydrocortisone  2.5 % ointment Apply to aa's rash BID up to one week.    . metoprolol TARTrate (LOPRESSOR) 50 MG tablet Take 50 mg by mouth 2 (two)  times daily    . SUMAtriptan (IMITREX) 50 MG tablet Take 1 tablet (50 mg total) by mouth once as needed for Migraine for up to 1 dose May take a second dose after 2 hours if needed. 8 tablet 3  . traMADoL (ULTRAM) 50 mg tablet Take 1 tablet (50 mg total) by mouth every 8 (eight) hours as needed for Pain 21 tablet 0  . metoprolol SUCCinate (TOPROL-XL) 100 MG XL tablet Take 1 tablet (100 mg total) by mouth once daily 90 tablet 0  . rivaroxaban (XARELTO) 20 mg tablet Take 1 tablet (20 mg total) by mouth daily with dinner 30 tablet 1  . triamcinolone 0.1 % cream Apply topically once daily 80 g 1   No facility-administered medications prior to visit.    Allergies  Allergen Reactions  . Covid-19 Vaccine, Ad26.Cov2.S Andree) Hives    Family History  Problem Relation Name Age of Onset  . Myocardial Infarction (Heart attack) Mother    . Stroke Father Elsie Poncho   . Coronary Artery Disease (Blocked arteries around heart) Father Elsie Poncho   . Lung disease Father Elsie Poncho   . Skin cancer Father Elsie Poncho   . Mental retardation Sister    . Coronary Artery Disease (Blocked arteries around heart) Maternal Grandfather    . Diabetes Paternal Grandmother Designer, jewellery     Social History   Tobacco Use  . Smoking status: Former    Current packs/day: 0.00    Types: Cigarettes    Start date: 06/18/1970    Quit date: 01/18/1988    Years since quitting: 35.8  . Smokeless tobacco: Never  Vaping Use  . Vaping status: Never Used  Substance Use Topics  . Alcohol use: Yes    Alcohol/week: 0.0 standard drinks of alcohol    Types: 7 Glasses of wine per week    Comment: 2-3 Drinks per week   . Drug use: No      Review of Systems  Pertinent positive and negative ROS as documented in HPI.    Objective:   Blood pressure (!) 159/84, pulse 76, temperature 37.1 C (98.7 F), temperature source Oral, height 152.4 cm (5'), weight 51.2 kg (112 lb 12.8 oz), SpO2  100%.  Physical Exam Vitals and nursing note reviewed.  Constitutional:      General: She is not in acute distress.    Appearance: Normal appearance. She is not ill-appearing, toxic-appearing or diaphoretic.  HENT:     Head: Normocephalic and atraumatic.     Mouth/Throat:     Mouth: Mucous membranes are moist.     Pharynx: Oropharynx is clear.  Eyes:     General: No scleral icterus.    Conjunctiva/sclera: Conjunctivae normal.     Pupils: Pupils are equal, round,  and reactive to light.  Cardiovascular:     Rate and Rhythm: Normal rate and regular rhythm.     Heart sounds: Normal heart sounds.  Pulmonary:     Effort: Pulmonary effort is normal. No respiratory distress.     Breath sounds: Normal breath sounds. No wheezing or rhonchi.     Comments: Respiratory exam as documented. Abdominal:     General: Bowel sounds are normal. There is no distension.     Palpations: Abdomen is soft.     Tenderness: There is no abdominal tenderness. There is no right CVA tenderness, left CVA tenderness, guarding or rebound.     Comments: Gastrointestinal exam as documented above.   Musculoskeletal:     Cervical back: Normal range of motion.     Comments: No midline cervical, thoracic, or lumbar TTP. No direct TTP of lumbar muscles. Strength and sensation grossly intact in b/l lower extremities. Palpable pulses. Able to ambulate.  Skin:    General: Skin is warm and dry.  Neurological:     Mental Status: She is alert.  Psychiatric:        Mood and Affect: Mood normal.        Behavior: Behavior normal.     Results for orders placed or performed in visit on 10/27/23  Urinalysis w/Microscopic  Result Value Ref Range   Color Colorless Colorless, Straw, Light Yellow, Yellow, Dark Yellow   Clarity Cloudy (!) Clear   Specific Gravity 1.011 1.005 - 1.030   pH, Urine 5.5 5.0 - 8.0   Protein, Urinalysis Negative Negative mg/dL   Glucose, Urinalysis Negative Negative mg/dL   Ketones, Urinalysis  Negative Negative mg/dL   Blood, Urinalysis 3+ (!) Negative   Nitrite, Urinalysis Negative Negative   Leukocyte Esterase, Urinalysis 1+ (!) Negative   Bilirubin, Urinalysis Negative Negative   Urobilinogen, Urinalysis 0.2 0.2 - 1.0 mg/dL   WBC, UA 13 (H) <=5 /hpf   Red Blood Cells, Urinalysis >182 (H) <=3 /hpf   Bacteria, Urinalysis 0-5 0 - 5 /hpf   Squamous Epithelial Cells, Urinalysis 0 /hpf     Assessment/Plan:   1. Hematuria, unspecified type -     Urinalysis w/Microscopic -     Urine Culture, Routine - [facility] -     ciprofloxacin HCl (CIPRO) 500 MG tablet; Take 1 tablet (500 mg total) by mouth 2 (two) times daily for 7 days  Dispense: 14 tablet; Refill: 0  2. Leukocytes in urine -     ciprofloxacin HCl (CIPRO) 500 MG tablet; Take 1 tablet (500 mg total) by mouth 2 (two) times daily for 7 days  Dispense: 14 tablet; Refill: 0   Patient is pleasant, cooperative, in no apparent distress. Vitals reviewed - BP is elevated today - monitor. Heart RRR today. Lungs clear. No respiratory distress. Here for hematuria. Benign abdominal exam. No CVA tenderness. Strength and sensation grossly intact in b/l lower extremities. U/a does have hematuria as well as leukocytes. Unclear etiology of the hematuria - ?infectious vs inflammatory (interstitial cystitis) vs nephrolithiasis (despite previous x-ray) other cause. At this time, would send for repeat culture. Will start abx. Keep scheduled appointment with urology next week - likely needs additional work up with regard to the hematuria. Further supportive care discussed. ED precautions provided in AVS.    Attestation Statement:   I personally performed the service, non-incident to. (WP)   ASHLEY BRIDGETTE POISSON, PA     Patient Instructions  Urine culture pending.  Please take antibiotics as prescribed.  Tylenol  for pain relief.  Heat/ice to low back. Hydrate.  Keep scheduled follow up with Urology next week.  To ED if acutely worsening -  fever, worsening pain, not keeping food/fluids down, chest pain, shortness of breath.

## 2023-10-31 DIAGNOSIS — R3129 Other microscopic hematuria: Secondary | ICD-10-CM | POA: Insufficient documentation

## 2023-10-31 NOTE — Assessment & Plan Note (Signed)
 SABRA

## 2023-10-31 NOTE — Progress Notes (Unsigned)
   11/01/23 10:46 AM   Natalie Monroe 29-Jan-1950 [id number]   HPI: 73 y.o. female here for initial evaluation of microhematuria + gross hematuria  UA 10/27/23 - >182 RBC, 13 WBC, neg nitrite, +1 LE UA 10/17/23 - isolated 7 RBC UA 10/11/23 - 6-10 RBC, 21-50 WBC, small LE   - culture = mixed growth UA 08/31/23 - negative UA 08/28/23 - negative UA 02/27/23 - negative  Gross hematuria occurred in the last 3-4 weeks In the context of new diagnosis A-fib with RVR, placed on Eliquis, as well as intermittent syncope, + vomiting Concomitant back pain, more towards the left flank Denies overt lower urinary tract symptoms, dysuria, increased urgency or frequency  Remote history of nephrolithiasis, passed stone several years ago Perhaps prior cystoscopy with Dr. Kassie  Prior smoker, quit over 30 years ago No family history of GU malignancies    PMH: Past Medical History:  Diagnosis Date   Basal cell carcinoma 06/23/2021   right inf nasal ala, EDC 08/10/21   Breast cancer (HCC) 2006   right breast, chemo and radiation   Dysplastic nevus 10/28/2021   right lateral breast, moderate atypia   Dysplastic nevus 11/04/2021   Right lateral foot. Moderate atypia.   Hypertension    Personal history of chemotherapy    Personal history of radiation therapy     Surgical History: Past Surgical History:  Procedure Laterality Date   ABDOMINAL HYSTERECTOMY     BREAST BIOPSY Bilateral 2006   left negative right positive   BREAST LUMPECTOMY Right 2006   positive   MASTECTOMY, PARTIAL Right    OOPHORECTOMY      Family History: Family History  Problem Relation Age of Onset   Breast cancer Maternal Aunt 48    Social History:  reports that she has quit smoking. She has never used smokeless tobacco. She reports that she does not currently use alcohol. She reports that she does not use drugs.      Physical Exam: BP (!) 146/89 (BP Location: Left Arm, Patient Position: Sitting, Cuff Size:  Normal)   Pulse 90   Wt 110 lb (49.9 kg)   SpO2 100%   BMI 21.48 kg/m    Constitutional:  Alert and oriented, No acute distress. Cardiovascular: No clubbing, cyanosis, or edema. Respiratory: Normal respiratory effort, no increased work of breathing. GI: Nondistended Skin: No rashes, bruises or suspicious lesions. Neurologic: Grossly intact, no focal deficits, moving all 4 extremities. Psychiatric: Normal mood and affect.  Laboratory Data: UA 10/27/23 - >182 RBC, 13 WBC, neg nitrite, +1 LE UA 10/17/23 - isolated 7 RBC UA 10/11/23 - 6-10 RBC, 21-50 WBC, small LE   - culture = mixed growth UA 08/31/23 - negative UA 08/28/23 - negative UA 02/27/23 - negative   Latest Reference Range & Units 10/14/23 11:44  Creatinine 0.44 - 1.00 mg/dL 9.03    Pertinent Imaging: N/A    Assessment & Plan:    Gross hematuria Assessment & Plan: 3-4-week history of GH plus isolated AMH New diagnosis A-fib (now on Eliquis) Low back pain, nausea vomiting, intermittent syncope Remote history of nephrolithiasis  - CT urogram + diagnostic cystoscopy + cytology, next available  Orders: -     CT HEMATURIA WORKUP; Future      Penne Skye, MD 11/01/2023  [facility] [address], [address] [place], [place] [address] [phone]

## 2023-11-01 ENCOUNTER — Ambulatory Visit (INDEPENDENT_AMBULATORY_CARE_PROVIDER_SITE_OTHER): Admitting: Urology

## 2023-11-01 ENCOUNTER — Other Ambulatory Visit: Payer: Self-pay

## 2023-11-01 ENCOUNTER — Other Ambulatory Visit
Admission: RE | Admit: 2023-11-01 | Discharge: 2023-11-01 | Disposition: A | Discharge status: Home / Self Care | Attending: Urology | Admitting: Urology

## 2023-11-01 ENCOUNTER — Institutional Professional Consult (permissible substitution): Admitting: Cardiology

## 2023-11-01 VITALS — BP 146/89 | HR 90 | Wt 110.0 lb

## 2023-11-01 DIAGNOSIS — R310 Gross hematuria: Secondary | ICD-10-CM | POA: Insufficient documentation

## 2023-11-01 DIAGNOSIS — R3129 Other microscopic hematuria: Secondary | ICD-10-CM

## 2023-11-01 LAB — URINALYSIS, COMPLETE (UACMP) WITH MICROSCOPIC
Bilirubin Urine: NEGATIVE
Glucose, UA: NEGATIVE mg/dL
Ketones, ur: NEGATIVE mg/dL
Nitrite: NEGATIVE
Protein, ur: NEGATIVE mg/dL
Specific Gravity, Urine: 1.01 (ref 1.005–1.030)
pH: 6.5 (ref 5.0–8.0)

## 2023-11-01 NOTE — Assessment & Plan Note (Signed)
 3-4-week history of GH plus isolated AMH New diagnosis A-fib (now on Eliquis) Low back pain, nausea vomiting, intermittent syncope Remote history of nephrolithiasis  - CT urogram + diagnostic cystoscopy + cytology, next available

## 2023-11-01 NOTE — Patient Instructions (Addendum)
 Please contact Central Scheduling to set up your CT at [phone].   Cystoscopy Cystoscopy is a procedure that is used to help diagnose and sometimes treat conditions that affect the lower urinary tract. The lower urinary tract includes the bladder and the urethra. The urethra is the tube that drains urine from the bladder. Cystoscopy is done using a thin, tube-shaped instrument with a light and camera at the end (cystoscope). The cystoscope may be hard or flexible, depending on the goal of the procedure. The cystoscope is inserted through the urethra, into the bladder. Cystoscopy may be recommended if you have: Urinary tract infections that keep coming back. Blood in the urine (hematuria). An inability to control when you urinate (urinary incontinence) or an overactive bladder. Unusual cells found in a urine sample. A blockage in the urethra, such as a urinary stone. Painful urination. An abnormality in the bladder found during an intravenous pyelogram (IVP) or CT scan. What are the risks? Generally, this is a safe procedure. However, problems may occur, including: Infection. Bleeding.  What happens during the procedure?  You will be given one or more of the following: A medicine to numb the area (local anesthetic). The area around the opening of your urethra will be cleaned. The cystoscope will be passed through your urethra into your bladder. Germ-free (sterile) fluid will flow through the cystoscope to fill your bladder. The fluid will stretch your bladder so that your health care provider can clearly examine your bladder walls. Your doctor will look at the urethra and bladder. The cystoscope will be removed The procedure may vary among health care providers  What can I expect after the procedure? After the procedure, it is common to have: Some soreness or pain in your urethra. Urinary symptoms. These include: Mild pain or burning when you urinate. Pain should stop within a few  minutes after you urinate. This may last for up to a few days after the procedure. A small amount of blood in your urine for several days. Feeling like you need to urinate but producing only a small amount of urine. Follow these instructions at home: General instructions Return to your normal activities as told by your health care provider.  Drink plenty of fluids after the procedure. Keep all follow-up visits as told by your health care provider. This is important. Contact a health care provider if you: Have pain that gets worse or does not get better with medicine, especially pain when you urinate lasting longer than 72 hours after the procedure. Have trouble urinating. Get help right away if you: Have blood clots in your urine. Have a fever or chills. Are unable to urinate. Summary Cystoscopy is a procedure that is used to help diagnose and sometimes treat conditions that affect the lower urinary tract. Cystoscopy is done using a thin, tube-shaped instrument with a light and camera at the end. After the procedure, it is common to have some soreness or pain in your urethra. It is normal to have blood in your urine after the procedure.  If you were prescribed an antibiotic medicine, take it as told by your health care provider.  This information is not intended to replace advice given to you by your health care provider. Make sure you discuss any questions you have with your health care provider. Document Revised: 12/26/2017 Document Reviewed: 12/26/2017 Elsevier Patient Education  2020 [employer].

## 2023-11-05 ENCOUNTER — Encounter: Payer: Self-pay | Admitting: Dermatology

## 2023-11-08 ENCOUNTER — Ambulatory Visit: Admitting: Urology

## 2023-11-10 ENCOUNTER — Ambulatory Visit
Admission: RE | Admit: 2023-11-10 | Discharge: 2023-11-10 | Disposition: A | Discharge status: Home / Self Care | Source: Ambulatory Visit | Attending: Urology | Admitting: Urology

## 2023-11-10 DIAGNOSIS — R310 Gross hematuria: Secondary | ICD-10-CM | POA: Diagnosis present

## 2023-11-10 MED ORDER — IOHEXOL 300 MG/ML  SOLN
150.0000 mL | Freq: Once | INTRAMUSCULAR | Status: AC | PRN
  Administered 2023-11-10: 150 mL via INTRAVENOUS

## 2023-11-14 ENCOUNTER — Ambulatory Visit: Payer: Self-pay | Admitting: Urology

## 2023-11-14 ENCOUNTER — Ambulatory Visit (INDEPENDENT_AMBULATORY_CARE_PROVIDER_SITE_OTHER): Payer: Medicare Other | Admitting: Dermatology

## 2023-11-14 ENCOUNTER — Encounter: Payer: Self-pay | Admitting: Dermatology

## 2023-11-14 DIAGNOSIS — D1801 Hemangioma of skin and subcutaneous tissue: Secondary | ICD-10-CM

## 2023-11-14 DIAGNOSIS — L570 Actinic keratosis: Secondary | ICD-10-CM | POA: Diagnosis not present

## 2023-11-14 DIAGNOSIS — B009 Herpesviral infection, unspecified: Secondary | ICD-10-CM

## 2023-11-14 DIAGNOSIS — L814 Other melanin hyperpigmentation: Secondary | ICD-10-CM | POA: Diagnosis not present

## 2023-11-14 DIAGNOSIS — M67442 Ganglion, left hand: Secondary | ICD-10-CM

## 2023-11-14 DIAGNOSIS — L689 Hypertrichosis, unspecified: Secondary | ICD-10-CM

## 2023-11-14 DIAGNOSIS — L821 Other seborrheic keratosis: Secondary | ICD-10-CM

## 2023-11-14 DIAGNOSIS — Z85828 Personal history of other malignant neoplasm of skin: Secondary | ICD-10-CM

## 2023-11-14 DIAGNOSIS — Z1283 Encounter for screening for malignant neoplasm of skin: Secondary | ICD-10-CM

## 2023-11-14 DIAGNOSIS — L578 Other skin changes due to chronic exposure to nonionizing radiation: Secondary | ICD-10-CM | POA: Diagnosis not present

## 2023-11-14 DIAGNOSIS — D229 Melanocytic nevi, unspecified: Secondary | ICD-10-CM

## 2023-11-14 DIAGNOSIS — Z86018 Personal history of other benign neoplasm: Secondary | ICD-10-CM

## 2023-11-14 MED ORDER — VALACYCLOVIR HCL 1 G PO TABS: ORAL_TABLET | ORAL | 11 refills | Status: AC

## 2023-11-14 NOTE — Progress Notes (Signed)
 Follow-Up Visit   Subjective  Natalie Monroe is a 73 y.o. female who presents for the following: Skin Cancer Screening and Full Body Skin Exam. Hx of BCC. Hx of dysplastic nevi.   The patient presents for Total-Body Skin Exam (TBSE) for skin cancer screening and mole check. The patient has spots, moles and lesions to be evaluated, some may be new or changing and the patient may have concern these could be cancer.    The following portions of the chart were reviewed this encounter and updated as appropriate: medications, allergies, medical history  Review of Systems:  No other skin or systemic complaints except as noted in HPI or Assessment and Plan.  Objective  Well appearing patient in no apparent distress; mood and affect are within normal limits.  A full examination was performed including scalp, head, eyes, ears, nose, lips, neck, chest, axillae, abdomen, back, buttocks, bilateral upper extremities, bilateral lower extremities, hands, feet, fingers, toes, fingernails, and toenails. All findings within normal limits unless otherwise noted below.   Relevant physical exam findings are noted in the Assessment and Plan.     R mid forearm x1 Erythematous thin papules/macules with gritty scale.   Assessment & Plan   SKIN CANCER SCREENING PERFORMED TODAY.  HISTORY OF BASAL CELL CARCINOMA OF THE SKIN.  R inf nasal ala  - No evidence of recurrence today - Recommend regular full body skin exams - Recommend daily broad spectrum sunscreen SPF 30+ to sun-exposed areas, reapply every 2 hours as needed.  - Call if any new or changing lesions are noted between office visits  HISTORY OF DYSPLASTIC NEVI. R lat breast, R lat foot  No evidence of recurrence today Recommend regular full body skin exams Recommend daily broad spectrum sunscreen SPF 30+ to sun-exposed areas, reapply every 2 hours as needed.  Call if any new or changing lesions are noted between office visits   ACTINIC  DAMAGE - Chronic condition, secondary to cumulative UV/sun exposure - diffuse scaly erythematous macules with underlying dyspigmentation - Recommend daily broad spectrum sunscreen SPF 30+ to sun-exposed areas, reapply every 2 hours as needed.  - Staying in the shade or wearing long sleeves, sun glasses (UVA+UVB protection) and wide brim hats (4-inch brim around the entire circumference of the hat) are also recommended for sun protection.  - Call for new or changing lesions.  LENTIGINES, SEBORRHEIC KERATOSES, HEMANGIOMAS - Benign normal skin lesions - Brown waxy papule at L post ankle- SK - Benign-appearing - Call for any changes   MELANOCYTIC NEVI - Tan-brown and/or pink-flesh-colored symmetric macules and papules - 4 x 3 mm  med dark brown macule at left abdomen - 4 mm pink brown papule at R axilla - Benign appearing on exam today - Observation - Call clinic for new or changing moles - Recommend daily use of broad spectrum spf 30+ sunscreen to sun-exposed areas.   DIGITAL MUCOUS CYST Exam: 5 mm violaceous firm SQ nodule at L-5 proximal dorsal finger  A digital mucous cyst also known as a myxoid cyst or pseudocyst is a ganglion cyst arising from the distal interphalangeal (DIP) joint of the finger or thumb (or, less commonly, toe). The cysts are believed to form from degeneration of connective tissue and are associated with osteoarthritic joints or injury. Although the exact etiology is unknown, it is likely that a small tear forms in a joint capsule or tendon sheath, allowing extravasation of synovial fluid into the adjacent tissue. When the fluid reacts with local  tissue, it becomes more gelatinous and a cyst wall forms. With any treatment, there is a high rate of recurrence.   Treatment options include: - Puncture / Incision & Drainage (I&D) - Intralesional steroid injection - Intralesional Sclerosant injection (Asclera/ Polidocanol) - Intralesional steroid + sclerosant -  Corticosteroid tape - Cryosurgery - Laser (CO2) - Infrared photocoagulation - Excision / Surgery  Treatment Plan: The patient will observe these symptoms, and report promptly any worsening or unexpected persistence.   Recommend hand surgeon for best results for complete removal. Patient defers referral at this time. RTC if changes.   Seborrheic Keratosis, h/o inflammation now resolved Exam: Waxy flesh macule at R dorsal hand, no erythema  Treatment: Benign. Observe   Hypertrichosis- focal Exam: hair growth at L elbow and L extensor forearm, R lateral upper calf  Chronic and persistent condition with duration or expected duration over one year. Condition is bothersome/symptomatic for patient. Currently flared.   Exam: Possibly r/t menopause and hormonal changes.  May remove hair as needed with electric razor, depilatory. Benign. 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 Currently well-controlled.   Herpes Simplex Virus = Cold Sores = Fever Blisters is a chronic recurring blistering; scabbing sore-producing viral infection that is recurrent usually in the same area triggered by stress, sun/UV exposure and trauma.  It is infectious and can be spread from person to person by direct contact.  It is not curable, but is treatable with topical and oral medication.   Treatment Plan Take Valacyclovir 2 tablets every 12 hours for 2 doses with a glass of water at the first sign of symptoms.       AK (ACTINIC KERATOSIS) R mid forearm x1 Actinic keratoses are precancerous spots that appear secondary to cumulative UV radiation exposure/sun exposure over time. They are chronic with expected duration over 1 year. A portion of actinic keratoses will progress to squamous cell carcinoma of the skin. It is not possible to reliably predict which spots will progress to skin cancer and so treatment is  recommended to prevent development of skin cancer.  Recommend daily broad spectrum sunscreen SPF 30+ to sun-exposed areas, reapply every 2 hours as needed.  Recommend staying in the shade or wearing long sleeves, sun glasses (UVA+UVB protection) and wide brim hats (4-inch brim around the entire circumference of the hat). Call for new or changing lesions. Destruction of lesion - R mid forearm x1  Destruction method: cryotherapy   Informed consent: discussed and consent obtained   Lesion destroyed using liquid nitrogen: Yes   Region frozen until ice ball extended beyond lesion: Yes   Outcome: patient tolerated procedure well with no complications   Post-procedure details: wound care instructions given   Additional details:  Prior to procedure, discussed risks of blister formation, small wound, skin dyspigmentation, or rare scar following cryotherapy. Recommend Vaseline ointment to treated areas while healing.   SKIN CANCER SCREENING   HISTORY OF BASAL CELL CARCINOMA (BCC) OF SKIN   HISTORY OF DYSPLASTIC NEVUS   ACTINIC SKIN DAMAGE   LENTIGO   SEBORRHEIC KERATOSIS   HEMANGIOMA OF SKIN   NEVUS   DIGITAL MUCOUS CYST OF FINGER OF LEFT HAND   HYPERTRICHOSIS   HERPES SIMPLEX VIRUS (HSV) INFECTION   Return in about 6 months (around 05/14/2024) for spot check.  I, Kate Fought, CMA, am acting as scribe for Rexene Rattler, MD.   Documentation: I have  reviewed the above documentation for accuracy and completeness, and I agree with the above.  Rexene Rattler, MD

## 2023-11-14 NOTE — Patient Instructions (Addendum)
 Cryotherapy Aftercare  Wash gently with soap and water everyday.   Apply Vaseline Jelly daily until healed.     Valacyclovir: Take 2 tablets with a glass of water at the first sign of symptoms repeat in 12 hours for 2 total doses.    Recommend daily broad spectrum sunscreen SPF 30+ to sun-exposed areas, reapply every 2 hours as needed. Call for new or changing lesions.  Staying in the shade or wearing long sleeves, sun glasses (UVA+UVB protection) and wide brim hats (4-inch brim around the entire circumference of the hat) are also recommended for sun protection.      Melanoma ABCDEs  Melanoma is the most dangerous type of skin cancer, and is the leading cause of death from skin disease.  You are more likely to develop melanoma if you: Have light-colored skin, light-colored eyes, or red or blond hair Spend a lot of time in the sun Tan regularly, either outdoors or in a tanning bed Have had blistering sunburns, especially during childhood Have a close family member who has had a melanoma Have atypical moles or large birthmarks  Early detection of melanoma is key since treatment is typically straightforward and cure rates are extremely high if we catch it early.   The first sign of melanoma is often a change in a mole or a new dark spot.  The ABCDE system is a way of remembering the signs of melanoma.  A for asymmetry:  The two halves do not match. B for border:  The edges of the growth are irregular. C for color:  A mixture of colors are present instead of an even brown color. D for diameter:  Melanomas are usually (but not always) greater than 6mm - the size of a pencil eraser. E for evolution:  The spot keeps changing in size, shape, and color.  Please check your skin once per month between visits. You can use a small mirror in front and a large mirror behind you to keep an eye on the back side or your body.   If you see any new or changing lesions before your next follow-up,  please call to schedule a visit.  Please continue daily skin protection including broad spectrum sunscreen SPF 30+ to sun-exposed areas, reapplying every 2 hours as needed when you're outdoors.   Staying in the shade or wearing long sleeves, sun glasses (UVA+UVB protection) and wide brim hats (4-inch brim around the entire circumference of the hat) are also recommended for sun protection.      Due to recent changes in healthcare laws, you may see results of your pathology and/or laboratory studies on MyChart before the doctors have had a chance to review them. We understand that in some cases there may be results that are confusing or concerning to you. Please understand that not all results are received at the same time and often the doctors may need to interpret multiple results in order to provide you with the best plan of care or course of treatment. Therefore, we ask that you please give us  2 business days to thoroughly review all your results before contacting the office for clarification. Should we see a critical lab result, you will be contacted sooner.   If You Need Anything After Your Visit  If you have any questions or concerns for your doctor, please call our main line at [phone] and press option 4 to reach your doctor's medical assistant. If no one answers, please leave a voicemail as directed and we  will return your call as soon as possible. Messages left after 4 pm will be answered the following business day.   You may also send us  a message via MyChart. We typically respond to MyChart messages within 1-2 business days.  For prescription refills, please ask your pharmacy to contact our office. Our fax number is [phone].  If you have an urgent issue when the clinic is closed that cannot wait until the next business day, you can page your doctor at the number below.    Please note that while we do our best to be available for urgent issues outside of office hours, we are  not available 24/7.   If you have an urgent issue and are unable to reach us , you may choose to seek medical care at your doctor's office, retail clinic, urgent care center, or emergency room.  If you have a medical emergency, please immediately call 911 or go to the emergency department.  Pager Numbers  - Dr. Hester: [phone]  - Dr. Jackquline: [phone]  - Dr. Claudene: [phone]   - Dr. Raymund: [phone]  In the event of inclement weather, please call our main line at [phone] for an update on the status of any delays or closures.  Dermatology Medication Tips: Please keep the boxes that topical medications come in in order to help keep track of the instructions about where and how to use these. Pharmacies typically print the medication instructions only on the boxes and not directly on the medication tubes.   If your medication is too expensive, please contact our office at [phone] option 4 or send us  a message through MyChart.   We are unable to tell what your co-pay for medications will be in advance as this is different depending on your insurance coverage. However, we may be able to find a substitute medication at lower cost or fill out paperwork to get insurance to cover a needed medication.   If a prior authorization is required to get your medication covered by your insurance company, please allow us  1-2 business days to complete this process.  Drug prices often vary depending on where the prescription is filled and some pharmacies may offer cheaper prices.  The website www.goodrx.com contains coupons for medications through different pharmacies. The prices here do not account for what the cost may be with help from insurance (it may be cheaper with your insurance), but the website can give you the price if you did not use any insurance.  - You can print the associated coupon and take it with your prescription to the pharmacy.  - You may also stop by our  office during regular business hours and pick up a GoodRx coupon card.  - If you need your prescription sent electronically to a different pharmacy, notify our office through [facility] or by phone at [phone] option 4.     Si Usted Necesita Algo Despus de Su Visita  Tambin puede enviarnos un mensaje a travs de Clinical Cytogeneticist. Por lo general respondemos a los mensajes de MyChart en el transcurso de 1 a 2 das hbiles.  Para renovar recetas, por favor pida a su farmacia que se ponga en contacto con nuestra oficina. Randi lakes de fax es [place] [phone].  Si tiene un asunto urgente cuando la clnica est cerrada y que no puede esperar hasta el siguiente da hbil, puede llamar/localizar a su doctor(a) al nmero que aparece a continuacin.   Por favor, tenga en cuenta que aunque hacemos todo  lo posible para estar disponibles para asuntos urgentes fuera del horario de oficina, no estamos disponibles las 24 horas del da, los 7 [address]  [address] de la [place].   Si tiene un problema urgente y no puede comunicarse con nosotros, puede optar por buscar atencin mdica  en el consultorio de su doctor(a), en una clnica privada, en un centro de atencin urgente o en una sala de emergencias.  Si tiene engineer, drilling, por favor llame inmediatamente al 911 o vaya a la sala de emergencias.  Nmeros de bper  - Dr. Hester: [phone]  - Dra. Jackquline: [phone]  - Dr. Claudene: [phone]  - Dra. Kitts: [phone]  En caso de inclemencias del [place], por favor llame a nuestra lnea principal al [phone] para una actualizacin sobre el estado de cualquier retraso o cierre.  Consejos para la medicacin en dermatologa: Por favor, guarde las cajas en las que vienen los medicamentos de uso tpico para ayudarle a seguir las instrucciones sobre dnde y cmo usarlos. Las farmacias generalmente imprimen las instrucciones del medicamento slo en las cajas y no directamente en los tubos del  [place].   Si su medicamento es muy caro, por favor, pngase en contacto con landry rieger llamando al [phone] y presione la opcin 4 o envenos un mensaje a travs de Clinical Cytogeneticist.   No podemos decirle cul ser su copago por los medicamentos por adelantado ya que esto es diferente dependiendo de la cobertura de su seguro. Sin embargo, es posible que podamos encontrar un medicamento sustituto a audiological scientist un formulario para que el seguro cubra el medicamento que se considera necesario.   Si se requiere una autorizacin previa para que su compaa de seguros cubra su medicamento, por favor permtanos de 1 a 2 das hbiles para completar este proceso.  Los precios de los medicamentos varan con frecuencia dependiendo del environmental consultant de dnde se surte la receta y alguna farmacias pueden ofrecer precios ms baratos.  El sitio web www.goodrx.com tiene cupones para medicamentos de health and safety inspector. Los precios aqu no tienen en cuenta lo que podra costar con la ayuda del seguro (puede ser ms barato con su seguro), pero el sitio web puede darle el precio si no utiliz tourist information centre manager.  - Puede imprimir el cupn correspondiente y llevarlo con su receta a la farmacia.  - Tambin puede pasar por nuestra oficina durante el horario de atencin regular y education officer, museum una tarjeta de cupones de GoodRx.  - Si necesita que su receta se enve electrnicamente a una farmacia diferente, informe a nuestra oficina a travs de MyChart de [place] o por telfono llamando al [phone] y presione la opcin 4.

## 2023-11-15 MED ORDER — TAMSULOSIN HCL 0.4 MG PO CAPS: 0.4000 mg | ORAL_CAPSULE | Freq: Every day | ORAL | 0 refills | Status: AC

## 2023-11-15 MED ORDER — TRAMADOL HCL 50 MG PO TABS: 50.0000 mg | ORAL_TABLET | Freq: Four times a day (QID) | ORAL | 0 refills | Status: AC | PRN

## 2023-11-28 DIAGNOSIS — N200 Calculus of kidney: Secondary | ICD-10-CM | POA: Insufficient documentation

## 2023-11-28 NOTE — Assessment & Plan Note (Signed)
 6mm obstructive Left proximal ureteral stone (dx 12/11/23)   - 3mm LLP renal stone  UA negative today  Reviewed her recent CT imaging.  This did reveal an obstructive left ureteral stone.  Fortunately she has had only mild to moderate pain, although this does explain her recent gross hematuria.  Reviewed different management options, including medical expulsive therapy, ESWL, ureteroscopic stone extraction/laser lithotripsy.  She would need to hold her Eliquis for ESWL.  I would consider performing URS while on Eliquis.   - Ultimately elected to proceed with ESWL.  She will need to hold her Eliquis 48 hours prior.  She will confirm with her cardiologist-although she previously received clearance  - repeat KUB morning of procedure -24-hour urine Litholink and serum studies ordered today, can be completed after stone treatment -We also reviewed the 5 basic tenets of kidney stone prevention, patient instructions provided

## 2023-11-28 NOTE — H&P (View-Only) (Signed)
   11/30/2023 4:00 PM   DOMINIGUE GELLNER 1950-01-19 [id number]  Reason for visit: Follow up nephrolithiasis/ GH   HPI: 73 y.o. female, follow up with me today Continuing to have mild to moderate left flank/back pain, with some radiations to left lower quadrant/urethra No UTI symptoms Ongoing intermittent gross hematuria  CTU (11/10/23) - 6mm obstructive Left proximal ureteral stone. Small 3-7mm left lower pole renal stone.     Prior HPI: Gross hematuria occurred in the last 3-4 weeks In the context of new diagnosis A-fib with RVR, placed on Eliquis, as well as intermittent syncope, + vomiting Concomitant back pain, more towards the left flank Denies overt lower urinary tract symptoms, dysuria, increased urgency or frequency   Remote history of nephrolithiasis, passed stone several years ago Perhaps prior cystoscopy with Dr. Kassie   Prior smoker, quit over 30 years ago No family history of GU malignancies    Physical Exam: BP 130/75   Pulse 81   Ht 5' (1.524 m)   Wt 111 lb (50.3 kg)   BMI 21.68 kg/m    Constitutional:  Alert and oriented, No acute distress.  Laboratory Data: N/A  Pertinent Imaging: I have personally viewed and interpreted the CTU (11/12/23) - 6mm obstructive Left proximal ureteral stone. Small 3-60mm left lower pole renal stone.  Otherwise morphologically normal kidneys, no right renal stone burden.  IMPRESSION: 1. There is a 0.6 cm calculus in the proximal third of the left ureter with mild associated hydronephrosis. 2. Additional punctuate nonobstructive calculus in the inferior pole of the left kidney. 3. No right-sided calculi or right-sided hydronephrosis. 4. No solid mass or suspicious contrast enhancement. 5. No other urinary tract filling defect on delayed phase imaging, including passage of contrast distal to proximal left ureteral calculus..    Assessment & Plan:    Nephrolithiasis Assessment & Plan: 6mm obstructive Left  proximal ureteral stone (dx 12/11/23)   - 3mm LLP renal stone  UA negative today  Reviewed her recent CT imaging.  This did reveal an obstructive left ureteral stone.  Fortunately she has had only mild to moderate pain, although this does explain her recent gross hematuria.  Reviewed different management options, including medical expulsive therapy, ESWL, ureteroscopic stone extraction/laser lithotripsy.  She would need to hold her Eliquis for ESWL.  I would consider performing URS while on Eliquis.   - Ultimately elected to proceed with ESWL.  She will need to hold her Eliquis 48 hours prior.  She will confirm with her cardiologist-although she previously received clearance  - repeat KUB morning of procedure -24-hour urine Litholink and serum studies ordered today, can be completed after stone treatment -We also reviewed the 5 basic tenets of kidney stone prevention, patient instructions provided  Orders: -     Urinalysis, Complete -     Litholink Serum Panel; Future -     Litholink 24Hr Urine Panel -     Ambulatory Referral For Surgery Scheduling       Penne JONELLE Skye, MD  [facility] [address], [address] [place], [place] [address] [phone]

## 2023-11-28 NOTE — Progress Notes (Unsigned)
   11/30/2023 4:00 PM   Natalie Monroe 09-Apr-1950 [id number]  Reason for visit: Follow up nephrolithiasis/ GH   HPI: 73 y.o. female, follow up with me today Continuing to have mild to moderate left flank/back pain, with some radiations to left lower quadrant/urethra No UTI symptoms Ongoing intermittent gross hematuria  CTU (11/10/23) - 6mm obstructive Left proximal ureteral stone. Small 3-58mm left lower pole renal stone.     Prior HPI: Gross hematuria occurred in the last 3-4 weeks In the context of new diagnosis A-fib with RVR, placed on Eliquis, as well as intermittent syncope, + vomiting Concomitant back pain, more towards the left flank Denies overt lower urinary tract symptoms, dysuria, increased urgency or frequency   Remote history of nephrolithiasis, passed stone several years ago Perhaps prior cystoscopy with Dr. Kassie   Prior smoker, quit over 30 years ago No family history of GU malignancies    Physical Exam: BP 130/75   Pulse 81   Ht 5' (1.524 m)   Wt 111 lb (50.3 kg)   BMI 21.68 kg/m    Constitutional:  Alert and oriented, No acute distress.  Laboratory Data: N/A  Pertinent Imaging: I have personally viewed and interpreted the CTU (11/12/23) - 6mm obstructive Left proximal ureteral stone. Small 3-44mm left lower pole renal stone.  Otherwise morphologically normal kidneys, no right renal stone burden.  IMPRESSION: 1. There is a 0.6 cm calculus in the proximal third of the left ureter with mild associated hydronephrosis. 2. Additional punctuate nonobstructive calculus in the inferior pole of the left kidney. 3. No right-sided calculi or right-sided hydronephrosis. 4. No solid mass or suspicious contrast enhancement. 5. No other urinary tract filling defect on delayed phase imaging, including passage of contrast distal to proximal left ureteral calculus..    Assessment & Plan:    Nephrolithiasis Assessment & Plan: 6mm obstructive Left  proximal ureteral stone (dx 12/11/23)   - 3mm LLP renal stone  UA negative today  Reviewed her recent CT imaging.  This did reveal an obstructive left ureteral stone.  Fortunately she has had only mild to moderate pain, although this does explain her recent gross hematuria.  Reviewed different management options, including medical expulsive therapy, ESWL, ureteroscopic stone extraction/laser lithotripsy.  She would need to hold her Eliquis for ESWL.  I would consider performing URS while on Eliquis.   - Ultimately elected to proceed with ESWL.  She will need to hold her Eliquis 48 hours prior.  She will confirm with her cardiologist-although she previously received clearance  - repeat KUB morning of procedure -24-hour urine Litholink and serum studies ordered today, can be completed after stone treatment -We also reviewed the 5 basic tenets of kidney stone prevention, patient instructions provided  Orders: -     Urinalysis, Complete -     Litholink Serum Panel; Future -     Litholink 24Hr Urine Panel -     Ambulatory Referral For Surgery Scheduling       Natalie JONELLE Skye, MD  [facility] [address] [place], [place] [address] [phone]

## 2023-11-30 ENCOUNTER — Ambulatory Visit (INDEPENDENT_AMBULATORY_CARE_PROVIDER_SITE_OTHER): Admitting: Urology

## 2023-11-30 VITALS — BP 130/75 | HR 81 | Ht 60.0 in | Wt 111.0 lb

## 2023-11-30 DIAGNOSIS — N200 Calculus of kidney: Secondary | ICD-10-CM | POA: Diagnosis not present

## 2023-11-30 LAB — URINALYSIS, COMPLETE
Bilirubin, UA: NEGATIVE
Glucose, UA: NEGATIVE
Ketones, UA: NEGATIVE
Nitrite, UA: NEGATIVE
Specific Gravity, UA: 1.02 (ref 1.005–1.030)
Urobilinogen, Ur: 0.2 mg/dL (ref 0.2–1.0)
pH, UA: 6 (ref 5.0–7.5)

## 2023-11-30 LAB — MICROSCOPIC EXAMINATION: RBC, Urine: >30 /HPF — AB (ref 0–2)

## 2023-11-30 NOTE — Patient Instructions (Addendum)
 Litholink Instructions [facility]   You will receive a box/kit in the mail that will have a urine jug and instructions in the kit.  When the box arrives you will need to call our office [phone] to schedule a LAB appointment.   You will need to do a 24hour urine and this should be done during the days that our office will be open.  For example any day from Sunday through Thursday.   If you take Vitamin C 100mg  or greater please stop this 5 days prior to collection.   How to collect the urine sample: On the day you start the urine sample this 1st morning urine should NOT be collected.  For the rest of the day including all night urines should be collected.  On the next morning the 1st urine should be collected and then you will be finished with the urine collections.   You will need to bring the box with you on your LAB appointment day after urine has been collected and all instructions are complete in the box.  Your blood will be drawn and the box will be collected by our Lab employee to be sent off for analysis.   When urine and blood is complete you will need to schedule a follow up appointment for lab results.   Today we reviewed the basic tenets of kidney stone management and prevention. We reviewed five broad recommendations:   - Adequate fluid intake >=2.5 L/day (goal urine output >=2 L/day). - Maintenance of normal dietary calcium (1,000-1,200 mg/day), avoid excess calcium supplements - Reduction of sodium intake (<2 g/day) to lower urinary calcium excretion. - Moderation of oxalate-rich foods (nuts, spinach, beets, chocolate, tea)  - Limit animal protein (meat, fish, poultry); promote balanced diet with fruits/vegetables.

## 2023-12-01 ENCOUNTER — Other Ambulatory Visit: Payer: Self-pay

## 2023-12-04 ENCOUNTER — Other Ambulatory Visit: Payer: Self-pay

## 2023-12-04 DIAGNOSIS — N200 Calculus of kidney: Secondary | ICD-10-CM

## 2023-12-04 NOTE — Progress Notes (Signed)
 ESWL ORDER FORM  Expected date of procedure: next available  Surgeon: any provider  Post op standing: 2-4wk follow up w/KUB prior  Anticoagulation/Aspirin/NSAID standing order: Hold all 72 hours prior  Anesthesia standing order: MAC  VTE standing: SCD's  Dx: Left Ureteral Stone  Procedure: left Extracorporeal shock wave lithotripsy  CPT : 50590  Standing Order Set:   *NPO after mn, KUB  *NS 174ml/hr, Keflex 500mg  PO, Benadryl 25mg  PO, Valium 10mg  PO, Zofran  4mg  IV  Medications if other than standing orders:   NONE

## 2023-12-04 NOTE — Progress Notes (Signed)
  Phone Number: [phone] for Surgical Coordinator Fax Number: [phone]  REQUEST FOR SURGICAL CLEARANCE      Date: 12/04/2023   Faxed to: Dr. Dewane, MD  Surgeon: Dr. Glendia Barba, MD     Date of Surgery: 12/07/2023  Operation: Left Extracorporeal Shock Wave Lithotripsy   Anesthesia Type: Moderate   Diagnosis: Left Ureteral Stone  Patient Requires:   Cardiac / Vascular Clearance : Yes  Reason: Patient will need to hold Eliquis for 2 days prior to surgery  Risk Assessment:    Low   []       Moderate   []     High   []           This patient is optimized for surgery  YES []       NO   []    I recommend further assessment/workup prior to surgery. YES []      NO  []   Appointment scheduled for: _______________________   Further recommendations: ____________________________________     Physician Signature:__________________________________   Printed Name: ________________________________________   Date: _________________

## 2023-12-04 NOTE — Progress Notes (Signed)
    [facility] ESWL POSTING SHEET        Patient Name: Natalie Monroe  DOB: 11/28/1950  MRN: [id number]  Surgeon:  Glendia Barba, MD  Diagnosis:  Left Ureteral Stone  CPT: 49409  ESWL DATE: 12/07/2023  ESWL TIME: 0730  Special Needs/Requirements: no       Cardiac/Medical/Pulmonary Clearance needed: Yes, will need to hold Eliquis       Clearance needed from Dr: Dewane       Clearance request sent on: Date: 12/04/23       Form Faxed to [facility]- [phone] Date:12/04/23       Form Faxed to [place]- [phone] Date: 12/04/23           Copy Made for Insurance PA:  Date: 12/04/23       Orders Entered in to Epic:  Date: 12/04/2023

## 2023-12-05 ENCOUNTER — Telehealth: Payer: Self-pay

## 2023-12-05 NOTE — Telephone Encounter (Signed)
 Spoke with Rexene with Dr. Charley Office. Dr. Custovic has informed patient to hold Eliquis for 48 hours prior to surgery. Patient aware. Last dosage yesterday 12/04/2023.

## 2023-12-06 MED ORDER — SODIUM CHLORIDE 0.9 % IV SOLN: INTRAVENOUS | Status: DC

## 2023-12-06 MED ORDER — ONDANSETRON HCL 4 MG/2ML IJ SOLN
4.0000 mg | Freq: Once | INTRAMUSCULAR | Status: AC
  Administered 2023-12-07: 4 mg via INTRAVENOUS

## 2023-12-06 MED ORDER — DIPHENHYDRAMINE HCL 25 MG PO CAPS
25.0000 mg | ORAL_CAPSULE | ORAL | Status: AC
  Administered 2023-12-07: 25 mg via ORAL

## 2023-12-06 MED ORDER — CEPHALEXIN 500 MG PO CAPS
500.0000 mg | ORAL_CAPSULE | Freq: Once | ORAL | Status: AC
  Administered 2023-12-07: 500 mg via ORAL

## 2023-12-06 MED ORDER — DIAZEPAM 5 MG PO TABS
10.0000 mg | ORAL_TABLET | ORAL | Status: AC
  Administered 2023-12-07: 10 mg via ORAL

## 2023-12-07 ENCOUNTER — Ambulatory Visit
Admission: RE | Admit: 2023-12-07 | Discharge: 2023-12-07 | Disposition: A | Discharge status: Home / Self Care | Attending: Urology | Admitting: Urology

## 2023-12-07 ENCOUNTER — Encounter: Payer: Self-pay | Admitting: Certified Registered"

## 2023-12-07 ENCOUNTER — Ambulatory Visit

## 2023-12-07 ENCOUNTER — Encounter: Payer: Self-pay | Admitting: Urology

## 2023-12-07 ENCOUNTER — Other Ambulatory Visit: Payer: Self-pay

## 2023-12-07 ENCOUNTER — Encounter: Admission: RE | Disposition: A | Payer: Self-pay | Source: Home / Self Care | Attending: Urology

## 2023-12-07 DIAGNOSIS — Z87891 Personal history of nicotine dependence: Secondary | ICD-10-CM | POA: Diagnosis not present

## 2023-12-07 DIAGNOSIS — R310 Gross hematuria: Secondary | ICD-10-CM | POA: Diagnosis not present

## 2023-12-07 DIAGNOSIS — I4891 Unspecified atrial fibrillation: Secondary | ICD-10-CM | POA: Diagnosis not present

## 2023-12-07 DIAGNOSIS — N201 Calculus of ureter: Secondary | ICD-10-CM

## 2023-12-07 DIAGNOSIS — Z7901 Long term (current) use of anticoagulants: Secondary | ICD-10-CM | POA: Insufficient documentation

## 2023-12-07 DIAGNOSIS — N132 Hydronephrosis with renal and ureteral calculous obstruction: Secondary | ICD-10-CM | POA: Diagnosis not present

## 2023-12-07 DIAGNOSIS — N200 Calculus of kidney: Secondary | ICD-10-CM

## 2023-12-07 HISTORY — PX: EXTRACORPOREAL SHOCK WAVE LITHOTRIPSY: SHX1557

## 2023-12-07 SURGERY — LITHOTRIPSY, ESWL
Anesthesia: Moderate Sedation | Laterality: Left

## 2023-12-07 MED ORDER — ONDANSETRON HCL 4 MG/2ML IJ SOLN
INTRAMUSCULAR | Status: AC
  Filled 2023-12-07: qty 2

## 2023-12-07 MED ORDER — DIPHENHYDRAMINE HCL 25 MG PO CAPS
ORAL_CAPSULE | ORAL | Status: AC
  Filled 2023-12-07: qty 1

## 2023-12-07 MED ORDER — ONDANSETRON 4 MG PO TBDP: 4.0000 mg | ORAL_TABLET | Freq: Three times a day (TID) | ORAL | 0 refills | Status: AC | PRN

## 2023-12-07 MED ORDER — DIAZEPAM 5 MG PO TABS
ORAL_TABLET | ORAL | Status: AC
  Filled 2023-12-07: qty 2

## 2023-12-07 MED ORDER — TAMSULOSIN HCL 0.4 MG PO CAPS: 0.4000 mg | ORAL_CAPSULE | Freq: Every day | ORAL | 0 refills | Status: DC

## 2023-12-07 MED ORDER — CEPHALEXIN 500 MG PO CAPS
ORAL_CAPSULE | ORAL | Status: AC
  Filled 2023-12-07: qty 1

## 2023-12-07 MED ORDER — HYDROCODONE-ACETAMINOPHEN 5-325 MG PO TABS: 1.0000 | ORAL_TABLET | Freq: Four times a day (QID) | ORAL | 0 refills | Status: DC | PRN

## 2023-12-07 NOTE — Interval H&P Note (Signed)
 History and Physical Interval Note:  12/07/2023 7:53 AM  Natalie Monroe  has presented today for surgery, with the diagnosis of Left Ureteral Stone.  The various methods of treatment have been discussed with the patient and family. After consideration of risks, benefits and other options for treatment, the patient has consented to  Procedure(s): LITHOTRIPSY, ESWL (Left) as a surgical intervention.  The patient's history has been reviewed, patient examined, no change in status, stable for surgery.  I have reviewed the patient's chart and labs.  Questions were answered to the patient's satisfaction.    CV:RRR Lungs:clear  Natalie Monroe

## 2023-12-07 NOTE — Discharge Instructions (Addendum)
 As per the [facility] discharge instructions A prescription for pain medication was sent to your pharmacy A prescription for tamsulosin which will help you pass stone fragments was also sent to your pharmacy Call [facility] at [phone] for pain not controlled with oral medications or fever greater than 101 degrees You have a follow-up appointment scheduled 12/28/2023

## 2023-12-08 ENCOUNTER — Encounter: Payer: Self-pay | Admitting: Urology

## 2023-12-12 ENCOUNTER — Other Ambulatory Visit: Payer: Self-pay

## 2023-12-12 DIAGNOSIS — N200 Calculus of kidney: Secondary | ICD-10-CM

## 2023-12-20 ENCOUNTER — Encounter: Payer: Self-pay | Admitting: Urology

## 2023-12-21 ENCOUNTER — Other Ambulatory Visit

## 2023-12-21 DIAGNOSIS — N200 Calculus of kidney: Secondary | ICD-10-CM

## 2023-12-26 LAB — LITHOLINK 24HR URINE PANEL
Ammonium, Urine: 13 mmol/(24.h) — ABNORMAL LOW (ref 15–60)
Calcium Oxalate Saturation: 2.67 — ABNORMAL LOW (ref 6.00–10.00)
Calcium Phosphate Saturation: 0.97 (ref 0.50–2.00)
Calcium, Urine: 252 mg/(24.h) — ABNORMAL HIGH (ref <200)
Calcium/Creatinine Ratio: 419 mg/g{creat} — ABNORMAL HIGH (ref 51–262)
Calcium/Kg Body Weight: 5.1 mg/kg/d — ABNORMAL HIGH (ref <4.0)
Chloride, Urine: 124 mmol/(24.h) (ref 70–250)
Citrate, Urine: 467 mg/(24.h) — ABNORMAL LOW (ref >550)
Creatinine, Urine: 603 mg/(24.h)
Creatinine/Kg Body Weight: 12.1 mg/kg/d (ref 8.7–20.3)
Cystine, Urine, Qualitative: NEGATIVE
Magnesium, Urine: 45 mg/(24.h) (ref 30–120)
Oxalate, Urine: 13 mg/(24.h) — ABNORMAL LOW (ref 20–40)
Phosphorus, Urine: 305 mg/(24.h) — ABNORMAL LOW (ref 600–1200)
Potassium, Urine: 21 mmol/(24.h) (ref 20–100)
Protein Catabolic Rate: 0.8 g/kg/d (ref 0.8–1.4)
Sodium, Urine: 130 mmol/(24.h) (ref 50–150)
Sulfate, Urine: 15 meq/(24.h) — ABNORMAL LOW (ref 20–80)
Urea Nitrogen, Urine: 4.92 g/(24.h) — ABNORMAL LOW (ref 6.00–14.00)
Uric Acid Saturation: 0.05 (ref <1.00)
Uric Acid, Urine: 318 mg/(24.h) (ref <750)
Urine Volume (Preserved): 2670 mL/(24.h) (ref 500–4000)
pH, 24 hr, Urine: 6.795 — ABNORMAL HIGH (ref 5.800–6.200)

## 2023-12-26 LAB — LITHOLINK SERUM PANEL
CO2: 21 mmol/L (ref 20–29)
Calcium: 9.6 mg/dL (ref 8.7–10.3)
Chloride: 109 mmol/L — ABNORMAL HIGH (ref 96–106)
Creatinine, Ser: 0.72 mg/dL (ref 0.57–1.00)
Magnesium: 2.5 mg/dL — ABNORMAL HIGH (ref 1.6–2.3)
Phosphorus: 3.8 mg/dL (ref 3.0–4.3)
Potassium: 4 mmol/L (ref 3.5–5.2)
Sodium: 148 mmol/L — ABNORMAL HIGH (ref 134–144)
Uric Acid: 4.1 mg/dL (ref 3.1–7.9)
eGFR: 89 mL/min/1.73 (ref >59)

## 2023-12-28 ENCOUNTER — Ambulatory Visit: Admitting: Physician Assistant

## 2023-12-28 ENCOUNTER — Encounter: Payer: Self-pay | Admitting: Physician Assistant

## 2023-12-28 ENCOUNTER — Other Ambulatory Visit
Admission: RE | Admit: 2023-12-28 | Discharge: 2023-12-28 | Disposition: A | Discharge status: Home / Self Care | Source: Home / Self Care | Attending: Urology | Admitting: Urology

## 2023-12-28 ENCOUNTER — Ambulatory Visit
Admission: RE | Admit: 2023-12-28 | Discharge: 2023-12-28 | Disposition: A | Discharge status: Home / Self Care | Source: Ambulatory Visit | Attending: Urology | Admitting: Urology

## 2023-12-28 ENCOUNTER — Ambulatory Visit
Admission: RE | Admit: 2023-12-28 | Discharge: 2023-12-28 | Disposition: A | Discharge status: Home / Self Care | Attending: Urology | Admitting: Urology

## 2023-12-28 VITALS — BP 153/86 | HR 73 | Ht 60.0 in | Wt 110.0 lb

## 2023-12-28 DIAGNOSIS — N201 Calculus of ureter: Secondary | ICD-10-CM

## 2023-12-28 DIAGNOSIS — N200 Calculus of kidney: Secondary | ICD-10-CM | POA: Insufficient documentation

## 2023-12-28 MED ORDER — TAMSULOSIN HCL 0.4 MG PO CAPS: 0.4000 mg | ORAL_CAPSULE | Freq: Every day | ORAL | 0 refills | Status: DC

## 2023-12-28 NOTE — Patient Instructions (Signed)
 Ultrasound scheduling: [phone]

## 2023-12-28 NOTE — Progress Notes (Unsigned)
 12/28/2023 3:08 PM   Cherylene B Brammer 1950-11-17 [id number]  CC: Chief Complaint  Patient presents with   Nephrolithiasis   HPI: Natalie Monroe is a 73 y.o. female who underwent ESWL with Dr. Twylla on 12/07/2023 for management of a 6 mm proximal left ureteral stone who presents today for follow up. Operative note describes smudging of the stone.   Today she reports she strained her urine diligently following shockwave lithotripsy and collected only a tiny amount of stone dust.  She has back pain at baseline, which has improved since her procedure..  KUB today with interval clearance of the proximal left ureteral stone.  She is currently being worked up for thyroid nodules and there has been a question of hyperparathyroidism.  She also inquires today about her Litholink results, the urine specimen of which was collected within about 2 weeks of her ESWL.  In-office UA today positive for trace intact blood; urine microscopy pan negative.  PMH: Past Medical History:  Diagnosis Date   Basal cell carcinoma 06/23/2021   right inf nasal ala, EDC 08/10/21   Breast cancer (HCC) 2006   right breast, chemo and radiation   Dysplastic nevus 10/28/2021   right lateral breast, moderate atypia   Dysplastic nevus 11/04/2021   Right lateral foot. Moderate atypia.   Hypertension    Personal history of chemotherapy    Personal history of radiation therapy     Surgical History: Past Surgical History:  Procedure Laterality Date   ABDOMINAL HYSTERECTOMY     BREAST BIOPSY Bilateral 2006   left negative right positive   BREAST LUMPECTOMY Right 2006   positive   EXTRACORPOREAL SHOCK WAVE LITHOTRIPSY Left 12/07/2023   Procedure: LITHOTRIPSY, ESWL;  Surgeon: Twylla Glendia BROCKS, MD;  Location: [facility] ORS;  Service: Urology;  Laterality: Left;   MASTECTOMY, PARTIAL Right    OOPHORECTOMY      Home Medications:  Allergies as of 12/28/2023   No Active Allergies      Medication List         Accurate as of December 28, 2023  3:08 PM. If you have any questions, ask your nurse or doctor.          STOP taking these medications    HYDROcodone -acetaminophen  5-325 MG tablet Commonly known as: NORCO/VICODIN Stopped by: Gram Siedlecki   hydrocortisone -pramoxine 2.5-1 % rectal cream Commonly known as: Analpram HC Stopped by: Shandie Bertz   metoprolol  tartrate 50 MG tablet Commonly known as: Lopressor  Stopped by: Lucie Hones   tamsulosin  0.4 MG Caps capsule Commonly known as: FLOMAX  Stopped by: Lucie Hones       TAKE these medications    acyclovir  400 MG tablet Commonly known as: ZOVIRAX  Take 1 tablet (400 mg total) by mouth 2 (two) times daily.   apixaban  5 MG Tabs tablet Commonly known as: ELIQUIS  Take 1 tablet (5 mg total) by mouth 2 (two) times daily. What changed: Another medication with the same name was removed. Continue taking this medication, and follow the directions you see here. Changed by: Eve Rey   atorvastatin 20 MG tablet Commonly known as: LIPITOR Take 20 mg by mouth daily.   calcium-vitamin D 500-200 MG-UNIT tablet Take 1 tablet by mouth daily.   CoQ10 100 MG Caps Take 100 mg by mouth daily.   cyanocobalamin 1000 MCG tablet Commonly known as: VITAMIN B12 Take 500 mcg by mouth daily.   hydrocortisone  2.5 % ointment Apply to aa's rash BID up to one week.  metoprolol  succinate 50 MG 24 hr tablet Commonly known as: TOPROL -XL Take 50 mg by mouth 2 (two) times daily.   ondansetron  4 MG disintegrating tablet Commonly known as: ZOFRAN -ODT Take 1 tablet (4 mg total) by mouth every 8 (eight) hours as needed for nausea or vomiting.   PROBIOTIC ADVANCED PO Take 1 tablet by mouth daily.   SUMAtriptan 100 MG tablet Commonly known as: IMITREX   valACYclovir  1000 MG tablet Commonly known as: Valtrex  Take 2 tablets with a glass of water at the first sign of symptoms repeat in 12  hours for 2 total doses.   Vitamin D3 25 MCG (1000 UT) Caps Take 1,000 Units by mouth daily.        Allergies:  Allergies[1]  Family History: Family History  Problem Relation Age of Onset   Breast cancer Maternal Aunt 57    Social History:   reports that she has quit smoking. She has never used smokeless tobacco. She reports that she does not currently use alcohol. She reports that she does not use drugs.  Physical Exam: BP (!) 153/86   Pulse 73   Ht 5' (1.524 m)   Wt 110 lb (49.9 kg)   BMI 21.48 kg/m   Constitutional:  Alert and oriented, no acute distress, nontoxic appearing HEENT: [place], AT Cardiovascular: No clubbing, cyanosis, or edema Respiratory: Normal respiratory effort, no increased work of breathing Skin: No rashes, bruises or suspicious lesions Neurologic: Grossly intact, no focal deficits, moving all 4 extremities Psychiatric: Normal mood and affect  Laboratory Data: Results for orders placed or performed in visit on 12/28/23  Microscopic Examination   Collection Time: 12/28/23  2:39 PM   Urine  Result Value Ref Range   WBC, UA None seen 0 - 5 /hpf   RBC, Urine 0-2 0 - 2 /hpf   Epithelial Cells (non renal) 0-10 0 - 10 /hpf   Bacteria, UA None seen None seen/Few  Urinalysis, Complete   Collection Time: 12/28/23  2:39 PM  Result Value Ref Range   Specific Gravity, UA <1.005 (L) 1.005 - 1.030   pH, UA 6.0 5.0 - 7.5   Color, UA Yellow Yellow   Appearance Ur Clear Clear   Leukocytes,UA Negative Negative   Protein,UA Negative Negative/Trace   Glucose, UA Negative Negative   Ketones, UA Negative Negative   RBC, UA Trace (A) Negative   Bilirubin, UA Negative Negative   Urobilinogen, Ur 0.2 0.2 - 1.0 mg/dL   Nitrite, UA Negative Negative   Microscopic Examination See below:    Pertinent Imaging: KUB, 12/28/2023: See Epic  I personally reviewed the images referenced above and note interval resolution of the proximal left ureteral stone.  Assessment  & Plan:   1. Left ureteral stone (Primary) Interval resolution of the stone on KUB and UA is bland, though her volume of stone fragments collected it is less than anticipated given known stone size.  I am suspicious of possible retained fragments.  Will send fragments for stone analysis, resume Flomax , and see her back in about 3 weeks with renal ultrasound prior.  Will check serum calcium and PTH today given concerns for hyperparathyroidism.  We may need to consider urine recollection for Litholink if she had retained stone/fragments at the time. - Urinalysis, Complete - Stone Analysis - tamsulosin  (FLOMAX ) 0.4 MG CAPS capsule; Take 1 capsule (0.4 mg total) by mouth daily.  Dispense: 30 capsule; Refill: 0 - PTH, Intact and Calcium; Future - US  RENAL; Future   Return  in about 3 weeks (around 01/18/2024) for RUS prior and UA.  Lucie Hones, PA-C  [facility] [place] [address], [address] [place], [place] [address] [phone]       [1] No Active Allergies

## 2023-12-29 ENCOUNTER — Ambulatory Visit: Payer: Self-pay | Admitting: Physician Assistant

## 2023-12-29 LAB — MICROSCOPIC EXAMINATION
Bacteria, UA: NONE SEEN
WBC, UA: NONE SEEN /HPF (ref 0–5)

## 2023-12-29 LAB — URINALYSIS, COMPLETE
Bilirubin, UA: NEGATIVE
Glucose, UA: NEGATIVE
Ketones, UA: NEGATIVE
Leukocytes,UA: NEGATIVE
Nitrite, UA: NEGATIVE
Protein,UA: NEGATIVE
Specific Gravity, UA: <1.005 — ABNORMAL LOW (ref 1.005–1.030)
Urobilinogen, Ur: 0.2 mg/dL (ref 0.2–1.0)
pH, UA: 6 (ref 5.0–7.5)

## 2023-12-29 LAB — PTH, INTACT AND CALCIUM
Calcium, Total (PTH): 9.5 mg/dL (ref 8.7–10.3)
PTH: 27 pg/mL (ref 15–65)

## 2024-01-02 ENCOUNTER — Ambulatory Visit: Admission: RE | Admit: 2024-01-02 | Discharge: 2024-01-02 | Attending: Physician Assistant

## 2024-01-02 DIAGNOSIS — N201 Calculus of ureter: Secondary | ICD-10-CM

## 2024-01-06 ENCOUNTER — Ambulatory Visit: Payer: Self-pay | Admitting: Urology

## 2024-01-09 LAB — STONE ANALYSIS
Calcium Oxalate Dihydrate: 90 %
Calcium Oxalate Monohydrate: 10 %
Weight Calculi: 16 mg

## 2024-01-19 ENCOUNTER — Ambulatory Visit: Admitting: Physician Assistant

## 2024-01-22 ENCOUNTER — Emergency Department
Admission: EM | Admit: 2024-01-22 | Discharge: 2024-01-22 | Disposition: A | Discharge status: Home / Self Care | Attending: Emergency Medicine | Admitting: Emergency Medicine

## 2024-01-22 ENCOUNTER — Other Ambulatory Visit: Payer: Self-pay

## 2024-01-22 ENCOUNTER — Emergency Department

## 2024-01-22 ENCOUNTER — Ambulatory Visit (INDEPENDENT_AMBULATORY_CARE_PROVIDER_SITE_OTHER): Admitting: Physician Assistant

## 2024-01-22 ENCOUNTER — Encounter: Payer: Self-pay | Admitting: Physician Assistant

## 2024-01-22 VITALS — BP 134/83 | HR 74 | Ht 60.0 in | Wt 111.0 lb

## 2024-01-22 DIAGNOSIS — I10 Essential (primary) hypertension: Secondary | ICD-10-CM | POA: Insufficient documentation

## 2024-01-22 DIAGNOSIS — I480 Paroxysmal atrial fibrillation: Secondary | ICD-10-CM | POA: Insufficient documentation

## 2024-01-22 DIAGNOSIS — R0789 Other chest pain: Secondary | ICD-10-CM | POA: Diagnosis present

## 2024-01-22 DIAGNOSIS — N200 Calculus of kidney: Secondary | ICD-10-CM | POA: Diagnosis not present

## 2024-01-22 DIAGNOSIS — Z7901 Long term (current) use of anticoagulants: Secondary | ICD-10-CM | POA: Diagnosis not present

## 2024-01-22 DIAGNOSIS — Z79899 Other long term (current) drug therapy: Secondary | ICD-10-CM | POA: Insufficient documentation

## 2024-01-22 HISTORY — DX: Unspecified atrial fibrillation: I48.91

## 2024-01-22 LAB — TROPONIN T, HIGH SENSITIVITY
Troponin T High Sensitivity: <15 ng/L (ref 0–19)
Troponin T High Sensitivity: <15 ng/L (ref 0–19)

## 2024-01-22 LAB — BASIC METABOLIC PANEL WITH GFR
Anion gap: 10 (ref 5–15)
BUN: 12 mg/dL (ref 8–23)
CO2: 28 mmol/L (ref 22–32)
Calcium: 9.6 mg/dL (ref 8.9–10.3)
Chloride: 106 mmol/L (ref 98–111)
Creatinine, Ser: 0.59 mg/dL (ref 0.44–1.00)
GFR, Estimated: >60 mL/min
Glucose, Bld: 105 mg/dL — ABNORMAL HIGH (ref 70–99)
Potassium: 4.4 mmol/L (ref 3.5–5.1)
Sodium: 144 mmol/L (ref 135–145)

## 2024-01-22 LAB — CBC
HCT: 41.3 % (ref 36.0–46.0)
Hemoglobin: 13.7 g/dL (ref 12.0–15.0)
MCH: 29.2 pg (ref 26.0–34.0)
MCHC: 33.2 g/dL (ref 30.0–36.0)
MCV: 88.1 fL (ref 80.0–100.0)
Platelets: 273 K/uL (ref 150–400)
RBC: 4.69 MIL/uL (ref 3.87–5.11)
RDW: 13.2 % (ref 11.5–15.5)
WBC: 7.7 K/uL (ref 4.0–10.5)
nRBC: 0 % (ref 0.0–0.2)

## 2024-01-22 NOTE — ED Provider Notes (Signed)
 "  [facility] Provider Note    Event Date/Time   First MD Initiated Contact with Patient 01/22/24 1746     (approximate)   History   Chest Pain   HPI  Natalie Monroe is a 74 y.o. female who presents to the ED for evaluation of Chest Pain   Review a [facility] cardiology clinic visit from November.  History of paroxysmal A-fib, HTN on Eliquis  and metoprolol  tartrate 50 mg twice daily.  Ureteral stones, lithotripsy 11/20.  Patient presents after a brief episode of chest pressure that occurred this morning.  She used her Kardia mobile device and it demonstrated atrial fibrillation with rapid rates in the 140s.  This episode lasted a matter of minutes, resolved, recurred later in the morning and has since resolved again and has not recurred this afternoon.  She reports feeling fine now and is at her baseline.   She shows me 2 separate EKG readings of Kardia mobile on her phone that do seem to demonstrate rapid A-fib.  Very wandering baseline and not the clearest readings but certainly tachycardic and irregular.   Physical Exam   Triage Vital Signs: ED Triage Vitals  Encounter Vitals Group     BP 01/22/24 1500 (!) 164/81     Girls Systolic BP Percentile --      Girls Diastolic BP Percentile --      Boys Systolic BP Percentile --      Boys Diastolic BP Percentile --      Pulse Rate 01/22/24 1500 75     Resp 01/22/24 1500 18     Temp 01/22/24 1500 98 F (36.7 C)     Temp src --      SpO2 01/22/24 1500 97 %     Weight 01/22/24 1500 111 lb (50.3 kg)     Height 01/22/24 1500 5' (1.524 m)     Head Circumference --      Peak Flow --      Pain Score 01/22/24 1459 0     Pain Loc --      Pain Education --      Exclude from Growth Chart --     Most recent vital signs: Vitals:   01/22/24 1900 01/22/24 1915  BP:  (!) 121/97  Pulse: 75   Resp: 13   Temp:    SpO2: 100%     General: Awake, no distress.  Well-appearing, pleasant and conversational,  asymptomatic CV:  Good peripheral perfusion.  NSR on the monitor, rate in the 70s Resp:  Normal effort.  Abd:  No distention.  MSK:  No deformity noted.  Neuro:  No focal deficits appreciated. Other:     ED Results / Procedures / Treatments   Labs (all labs ordered are listed, but only abnormal results are displayed) Labs Reviewed  BASIC METABOLIC PANEL WITH GFR - Abnormal; Notable for the following components:      Result Value   Glucose, Bld 105 (*)    All other components within normal limits  CBC  TROPONIN T, HIGH SENSITIVITY  TROPONIN T, HIGH SENSITIVITY    EKG Sinus rhythm with a rate of 76 bpm.  Normal axis and intervals.  No clear signs of acute ischemia.  RADIOLOGY CXR interpreted by me without evidence of acute cardiopulmonary pathology.  Official radiology report(s): DG Chest 2 View Result Date: 01/22/2024 EXAM: 2 VIEW(S) XRAY OF THE CHEST 01/22/2024 03:34:00 PM COMPARISON: Comparison 01/26/2013. CLINICAL HISTORY: cp FINDINGS: LUNGS AND PLEURA: No  focal pulmonary opacity. No pleural effusion. No pneumothorax. HEART AND MEDIASTINUM: Calcified aorta. No acute abnormality of the cardiac and mediastinal silhouettes. BONES AND SOFT TISSUES: Progressive bilateral shoulder degenerative joint disease. Midthoracic spine degenerative disc disease. No acute fracture. IMPRESSION: 1. No acute cardiopulmonary pathology. 2. Calcified aorta. Electronically signed by: Katheleen Faes MD 01/22/2024 04:06 PM EST RP Workstation: [id number]    PROCEDURES and INTERVENTIONS:  .1-3 Lead EKG Interpretation  Performed by: Claudene Rover, MD Authorized by: Claudene Rover, MD     Interpretation: normal     ECG rate:  74   ECG rate assessment: normal     Rhythm: sinus rhythm     Ectopy: none     Conduction: normal     Medications - No data to display   IMPRESSION / MDM / ASSESSMENT AND PLAN / ED COURSE  I reviewed the triage vital signs and the nursing notes.  Differential diagnosis  includes, but is not limited to, ACS, PTX, PNA, muscle strain/spasm, PE, dissection, anxiety, pleural effusion, symptomatic atrial fibrillation  {Patient presents with symptoms of an acute illness or injury that is potentially life-threatening.  Patient presents to the ED after episode of chest discomfort that occurred this morning alongside likely rapid atrial fibrillation that has resolved by the time I see her in the ED.  Looks well and asymptomatic, NSR on the monitor and her twelve-lead.  Normal exam, normal CBC, electrolyte/metabolic panel, troponin.  Will maintain her on the monitor, obtain a second troponin and likely discharge if this remains low.  Clinical Course as of 01/22/24 1940  Mon Jan 22, 2024  1903 Second troponin remains low, patient remains asymptomatic, suitable for outpatient management. [DS]    Clinical Course User Index [DS] Claudene Rover, MD     FINAL CLINICAL IMPRESSION(S) / ED DIAGNOSES   Final diagnoses:  Other chest pain     Rx / DC Orders   ED Discharge Orders     None        Note:  This document was prepared using Dragon voice recognition software and may include unintentional dictation errors.   Claudene Rover, MD 01/22/24 1940  "

## 2024-01-22 NOTE — ED Triage Notes (Addendum)
 Pt comes with chest tightness. Pt states hx of afib at end of Sept. Pt states her monitor at home it showed afib. Pt went to her appt at neurologist Pt cardiologist informed pt to come to ED bc she was still in afib. Pt is on eliquis 

## 2024-01-22 NOTE — Progress Notes (Signed)
 "  01/22/2024 4:51 PM   Natalie Monroe 06-01-1950 [id number]  CC: Chief Complaint  Patient presents with   Left ureteral stone   HPI: Natalie Monroe is a 74 y.o. female s/p ESWL with Dr. Twylla on 12/07/2023 for management of a 6 mm proximal left ureteral stone who presents today for follow-up.  When I saw her for her initial follow-up on 12/28/2023, we had some concern for a possible retained stone because she had not passed a large volume of fragments.  She had a subsequent renal ultrasound on 01/02/2024 that showed no hydronephrosis, shadowing stones, and bilateral ureteral jets.   Today she reports she continues to be comfortable and has no acute urologic concerns.  She has been in A-fib this morning and is waiting for a callback from her cardiologist.  She and her husband are building a new home and intend to move to [facility] later this year.  She will need a urology referral out there.  In-office UA today positive for 1+ blood; urine microscopy pan negative.  PMH: Past Medical History:  Diagnosis Date   A-fib (HCC)    Basal cell carcinoma 06/23/2021   right inf nasal ala, EDC 08/10/21   Breast cancer (HCC) 2006   right breast, chemo and radiation   Dysplastic nevus 10/28/2021   right lateral breast, moderate atypia   Dysplastic nevus 11/04/2021   Right lateral foot. Moderate atypia.   Hypertension    Personal history of chemotherapy    Personal history of radiation therapy     Surgical History: Past Surgical History:  Procedure Laterality Date   ABDOMINAL HYSTERECTOMY     BREAST BIOPSY Bilateral 2006   left negative right positive   BREAST LUMPECTOMY Right 2006   positive   EXTRACORPOREAL SHOCK WAVE LITHOTRIPSY Left 12/07/2023   Procedure: LITHOTRIPSY, ESWL;  Surgeon: Natalie Glendia BROCKS, MD;  Location: [facility] ORS;  Service: Urology;  Laterality: Left;   MASTECTOMY, PARTIAL Right    OOPHORECTOMY      Home Medications:  Allergies as of 01/22/2024   No  Active Allergies      Medication List        Accurate as of January 22, 2024  4:51 PM. If you have any questions, ask your nurse or doctor.          STOP taking these medications    calcium-vitamin D 500-200 MG-UNIT tablet Stopped by: Lucie Hones   tamsulosin  0.4 MG Caps capsule Commonly known as: FLOMAX  Stopped by: Lucie Hones       TAKE these medications    acyclovir  400 MG tablet Commonly known as: ZOVIRAX  Take 1 tablet (400 mg total) by mouth 2 (two) times daily.   apixaban  5 MG Tabs tablet Commonly known as: ELIQUIS  Take 1 tablet (5 mg total) by mouth 2 (two) times daily.   atorvastatin 20 MG tablet Commonly known as: LIPITOR Take 20 mg by mouth daily.   CoQ10 100 MG Caps Take 100 mg by mouth daily.   cyanocobalamin 1000 MCG tablet Commonly known as: VITAMIN B12 Take 500 mcg by mouth daily.   hydrocortisone  2.5 % ointment Apply to aa's rash BID up to one week.   metoprolol  succinate 50 MG 24 hr tablet Commonly known as: TOPROL -XL Take 50 mg by mouth 2 (two) times daily.   ondansetron  4 MG disintegrating tablet Commonly known as: ZOFRAN -ODT Take 1 tablet (4 mg total) by mouth every 8 (eight) hours as needed for nausea or vomiting.  PROBIOTIC ADVANCED PO Take 1 tablet by mouth daily.   SUMAtriptan 100 MG tablet Commonly known as: IMITREX   SUMAtriptan 50 MG tablet Commonly known as: IMITREX Take by mouth.   traMADol  50 MG tablet Commonly known as: ULTRAM  Take 50 mg by mouth.   valACYclovir  1000 MG tablet Commonly known as: Valtrex  Take 2 tablets with a glass of water at the first sign of symptoms repeat in 12 hours for 2 total doses.   Vitamin D3 25 MCG (1000 UT) Caps Take 1,000 Units by mouth daily.        Allergies:  Allergies[1]  Family History: Family History  Problem Relation Age of Onset   Breast cancer Maternal Aunt 43    Social History:   reports that she has quit smoking. She has never used  smokeless tobacco. She reports that she does not currently use alcohol. She reports that she does not use drugs.  Physical Exam: BP 134/83   Pulse 74   Ht 5' (1.524 m)   Wt 111 lb (50.3 kg)   BMI 21.68 kg/m   Constitutional:  Alert and oriented, no acute distress, nontoxic appearing HEENT: , AT Cardiovascular: No clubbing, cyanosis, or edema Respiratory: Normal respiratory effort, no increased work of breathing Skin: No rashes, bruises or suspicious lesions Neurologic: Grossly intact, no focal deficits, moving all 4 extremities Psychiatric: Normal mood and affect  Laboratory Data: See epic  Pertinent Imaging: Results for orders placed during the hospital encounter of 01/02/24  US  RENAL  Narrative EXAM: US  Retroperitoneum Complete, Renal. 01/02/2024 03:56:20 PM  TECHNIQUE: Real-time ultrasonography of the retroperitoneum renal was performed.  COMPARISON: CT scan 11/10/2023  CLINICAL HISTORY: Recurrent urinary tract infection.  FINDINGS:  FINDINGS: RIGHT KIDNEY/URETER: Right kidney measures 9.0 x 4.4 x 4.3 cm (volume = 89 cm\S\3). Normal cortical echogenicity. No hydronephrosis. No calculus. No mass.  LEFT KIDNEY/URETER: Left kidney measures 10.8 x 4.4 x 4.3 cm (volume = 110 cm\S\3). Normal cortical echogenicity. No hydronephrosis. No calculus. No mass. Incidental left extrarenal pelvis. Previous left renal calculus and previous left proximal ureteral calculus not currently sonographically apparent. Previous left hydronephrosis is not present.  BLADDER: Pre-void Bladder volume is 206.3 cm\S\3. Post-void Bladder volume is 31.8 cm\S\3. Bilateral ureteral jets are visible in the urinary bladder.  IMPRESSION: 1. No hydronephrosis or renal or proximal ureteral calculi identified. 2. Small post-void residual of 31.8 mL. 3. Bilateral ureteral jets are present.  Electronically signed by: Ryan Salvage MD 01/09/2024 11:54 AM EST RP Workstation:  [id number]  I personally reviewed the images referenced above and note no hydronephrosis or shadowing stones.  Assessment & Plan:   1. Nephrolithiasis (Primary) No evidence of retained stone/fragments on follow-up renal ultrasound.  UA remains bland.  I will see her back prior to her upcoming move with a KUB prior and plan to help her find a local urologist near her new home. - Urinalysis, Complete - Abdomen 1 view (KUB); Future   Return in about 7 months (around 08/21/2024) for Stone follow-up with KUB prior.  Lucie Hones, PA-C  [facility] [place] [address] [place], [place] [address] [phone]     [1] No Active Allergies  "

## 2024-01-22 NOTE — ED Notes (Signed)
 EDP at [facility]

## 2024-01-22 NOTE — ED Notes (Signed)
 No longer afib. No longer having pressure.

## 2024-01-23 LAB — URINALYSIS, COMPLETE
Bilirubin, UA: NEGATIVE
Glucose, UA: NEGATIVE
Ketones, UA: NEGATIVE
Leukocytes,UA: NEGATIVE
Nitrite, UA: NEGATIVE
Protein,UA: NEGATIVE
Specific Gravity, UA: 1.02 (ref 1.005–1.030)
Urobilinogen, Ur: 0.2 mg/dL (ref 0.2–1.0)
pH, UA: 5.5 (ref 5.0–7.5)

## 2024-01-23 LAB — MICROSCOPIC EXAMINATION

## 2024-05-14 ENCOUNTER — Ambulatory Visit: Admitting: Dermatology

## 2024-08-21 ENCOUNTER — Ambulatory Visit: Admitting: Physician Assistant
